# Patient Record
Sex: Male | Born: 1980 | Race: Black or African American | Hispanic: No | Marital: Single | Smoking: Never smoker
Health system: Southern US, Community
[De-identification: ages and names within clinical notes are randomized; demographics above are authoritative.]

## PROBLEM LIST (undated history)

## (undated) DIAGNOSIS — T7840XA Allergy, unspecified, initial encounter: Secondary | ICD-10-CM

## (undated) DIAGNOSIS — M419 Scoliosis, unspecified: Secondary | ICD-10-CM

## (undated) HISTORY — PX: SPINE SURGERY: SHX786

## (undated) HISTORY — DX: Scoliosis, unspecified: M41.9

## (undated) HISTORY — DX: Allergy, unspecified, initial encounter: T78.40XA

---

## 2005-10-27 ENCOUNTER — Emergency Department (HOSPITAL_COMMUNITY): Admission: EM | Admit: 2005-10-27 | Discharge: 2005-10-27 | Payer: Self-pay | Admitting: Emergency Medicine

## 2010-04-04 ENCOUNTER — Emergency Department: Payer: Self-pay | Admitting: Emergency Medicine

## 2011-02-11 IMAGING — CT CT HEAD WITHOUT CONTRAST
2 series · 16 of 30 positions shown, 20 images · non-contrast
Comparison: none

REASON FOR EXAM: HA ... pt in WR
COMMENTS:

PROCEDURE:     CT  - CT HEAD WITHOUT CONTRAST  - April 04, 2010 [DATE]
RESULT:     Comparison:  None
TECHNIQUE: Multiple axial images from the foramen magnum to the vertex were
obtained without IV contrast.

[Series 2: without · axial · non-contrast · 0.39mm/px · z∈[+393,+513]mm · 13 of 28 slices shown, 17 images]
[im 2/28  brain]
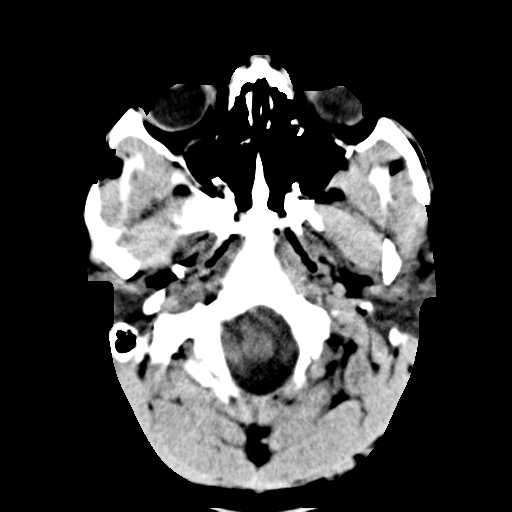
[im 2/28  bone]
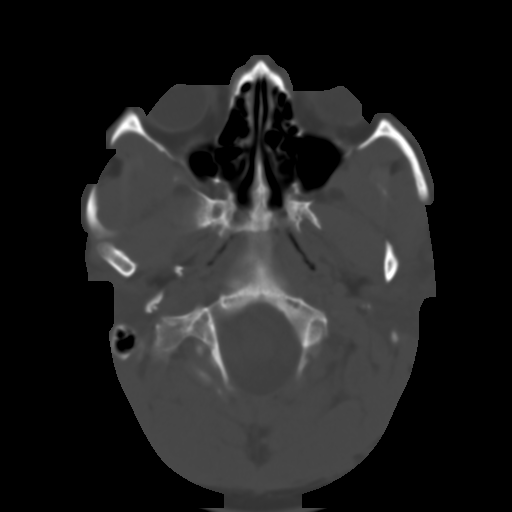
[im 4/28  brain]
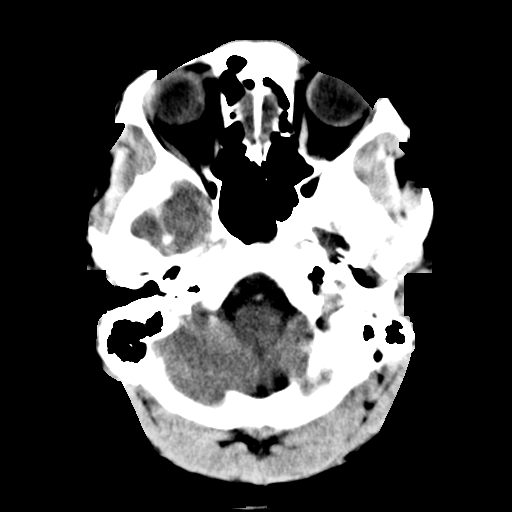
[im 6/28  brain]
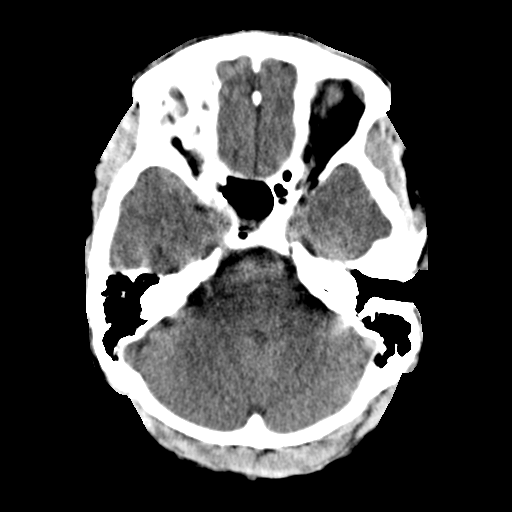
[im 8/28  brain]
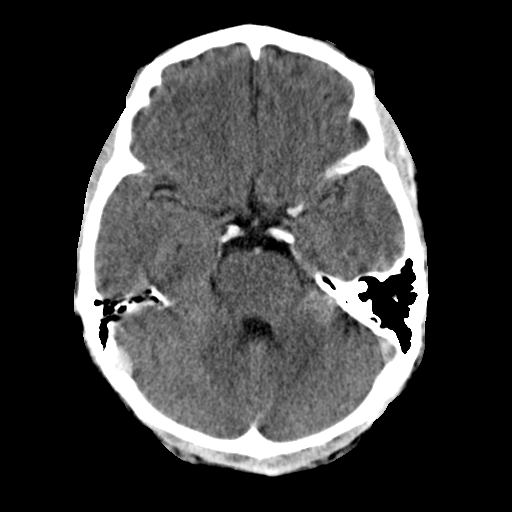
[im 10/28  brain]
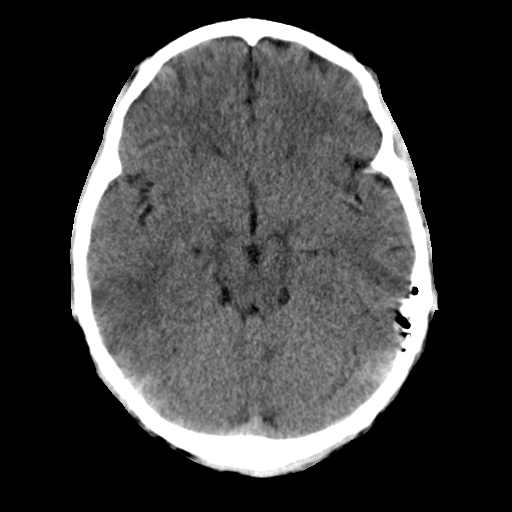
[im 10/28  bone]
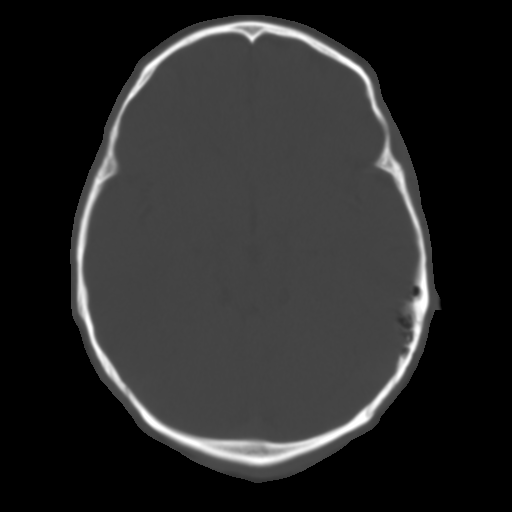
[im 12/28  brain]
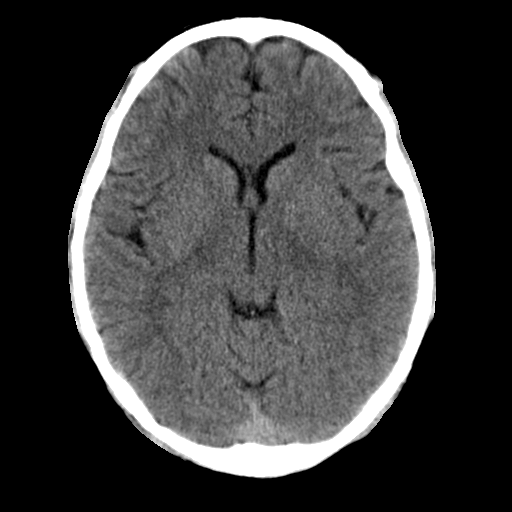
[im 14/28  brain]
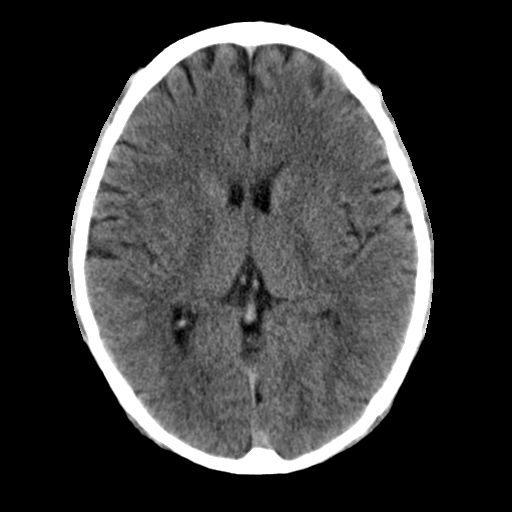
[im 16/28  brain]
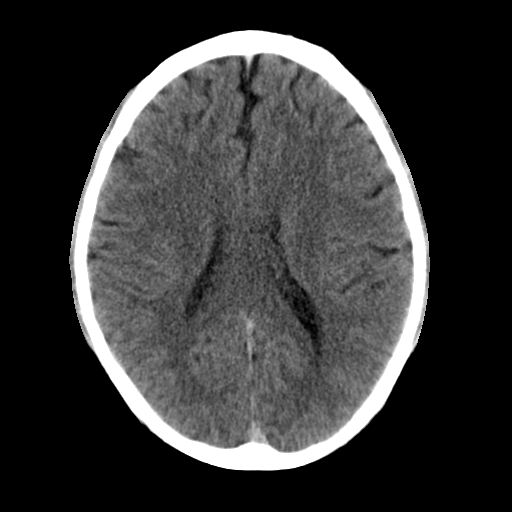
[im 18/28  brain]
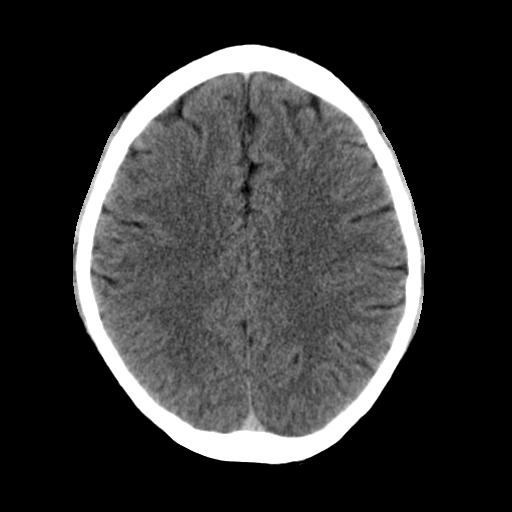
[im 18/28  bone]
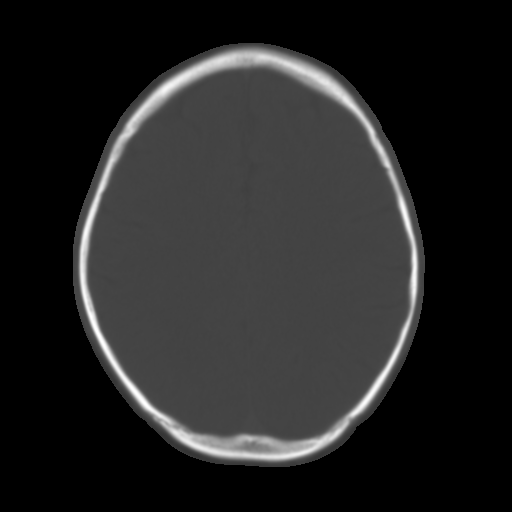
[im 20/28  brain]
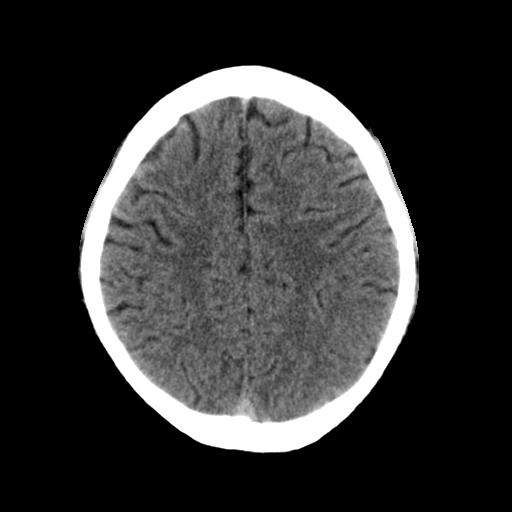
[im 22/28  brain]
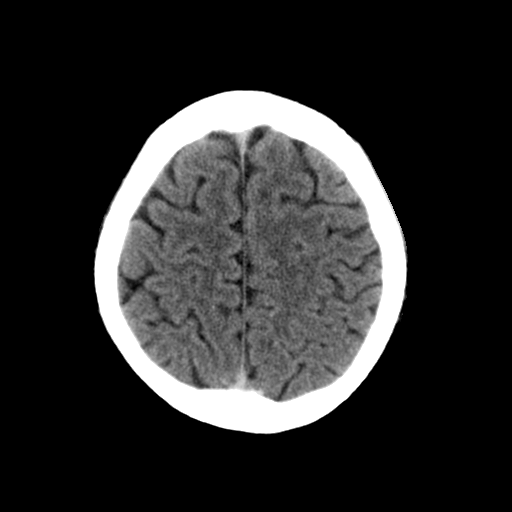
[im 24/28  brain]
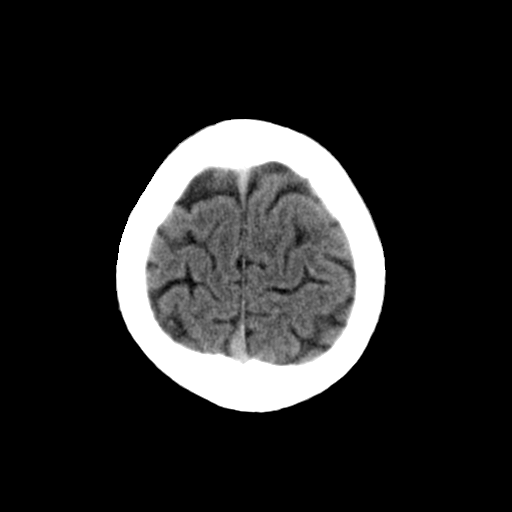
[im 26/28  brain]
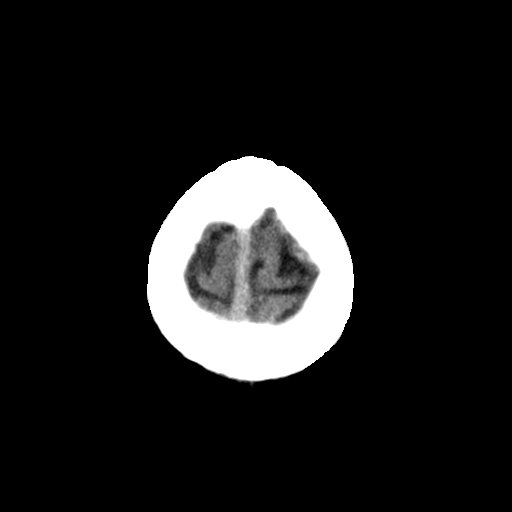
[im 26/28  bone]
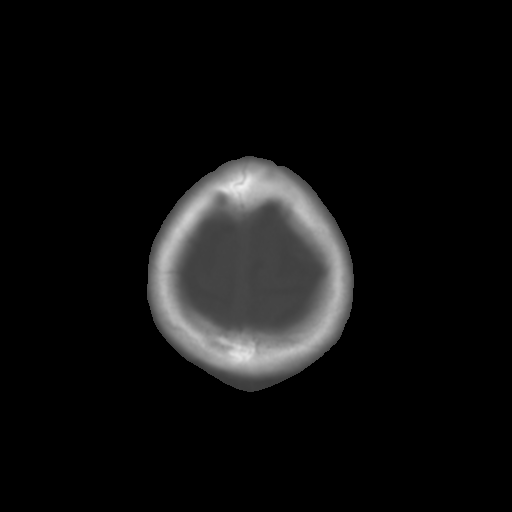

[Series 3: bone · axial · 0.39mm/px · z∈[+393,+433]mm · 3 of 28 slices shown]
[im 2/28  bone]
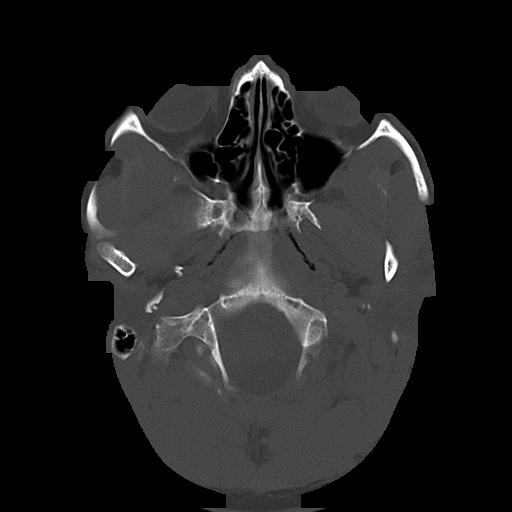
[im 6/28  bone]
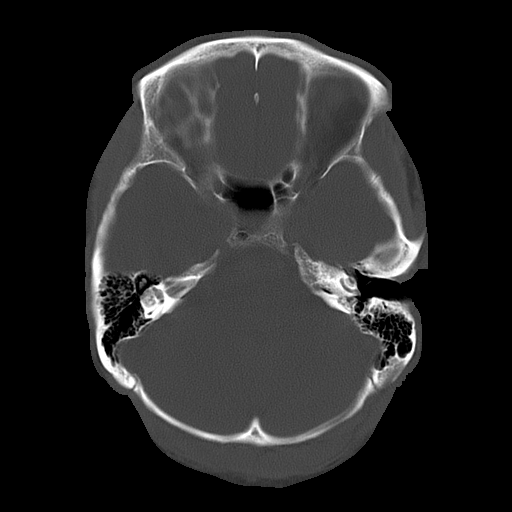
[im 10/28  bone]
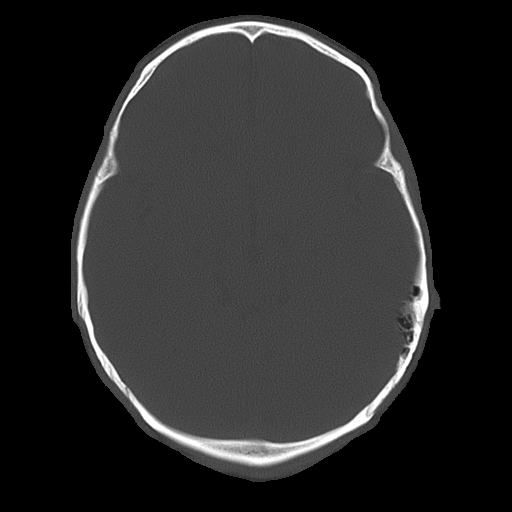

[16 of 30 positions shown; findings below may reference images not displayed]

FINDINGS: There is no evidence of mass effect, midline shift, or extra-axial fluid
collections.  There is no evidence of a space-occupying lesion or
intracranial hemorrhage. There is no evidence of a cortical-based area of
acute infarction.

The ventricles and sulci are appropriate for the patient's age. The basal
cisterns are patent.

Visualized portions of the orbits are unremarkable. The visualized portions
of the paranasal sinuses and mastoid air cells are unremarkable.

The osseous structures are unremarkable.
IMPRESSION: No acute intracranial process.

## 2013-10-09 ENCOUNTER — Encounter: Payer: Self-pay | Admitting: Family Medicine

## 2013-10-09 ENCOUNTER — Ambulatory Visit (INDEPENDENT_AMBULATORY_CARE_PROVIDER_SITE_OTHER): Payer: 59 | Admitting: Family Medicine

## 2013-10-09 VITALS — BP 130/84 | HR 84 | Temp 98.7°F | Resp 16 | Ht 67.75 in | Wt 173.2 lb

## 2013-10-09 DIAGNOSIS — Z0000 Encounter for general adult medical examination without abnormal findings: Secondary | ICD-10-CM

## 2013-10-09 DIAGNOSIS — R0683 Snoring: Secondary | ICD-10-CM

## 2013-10-09 DIAGNOSIS — Z9189 Other specified personal risk factors, not elsewhere classified: Secondary | ICD-10-CM

## 2013-10-09 DIAGNOSIS — J309 Allergic rhinitis, unspecified: Secondary | ICD-10-CM

## 2013-10-09 DIAGNOSIS — R0989 Other specified symptoms and signs involving the circulatory and respiratory systems: Secondary | ICD-10-CM

## 2013-10-09 DIAGNOSIS — G479 Sleep disorder, unspecified: Secondary | ICD-10-CM

## 2013-10-09 DIAGNOSIS — R0609 Other forms of dyspnea: Secondary | ICD-10-CM

## 2013-10-09 DIAGNOSIS — Z789 Other specified health status: Secondary | ICD-10-CM

## 2013-10-09 DIAGNOSIS — Z113 Encounter for screening for infections with a predominantly sexual mode of transmission: Secondary | ICD-10-CM

## 2013-10-09 DIAGNOSIS — Z8271 Family history of polycystic kidney: Secondary | ICD-10-CM

## 2013-10-09 LAB — POCT URINALYSIS DIPSTICK
Bilirubin, UA: NEGATIVE
Blood, UA: NEGATIVE
Glucose, UA: NEGATIVE
Ketones, UA: NEGATIVE
Leukocytes, UA: NEGATIVE
Nitrite, UA: NEGATIVE
PH UA: 7
SPEC GRAV UA: 1.02
UROBILINOGEN UA: 0.2

## 2013-10-09 NOTE — Progress Notes (Signed)
Subjective:    Patient ID: Bryan ReilBrian Banfill, male    DOB: 1981/07/30, 33 y.o.   MRN: [id number]  HPI  This 33 y.o. AA male is here for CPE prompted by recent diagnosis of family member w/ polycystic kidney disease (brother age 31). Pt advised by his mother to have fatigue/ snoring and sleep disturbance evaluated. He consumes excessive amounts of caffeine. Nutrition is fair.  HCM: Vision- Not recently.           Dental- Not recently.           IMM- Thinks they are current.  PMHx, Surg Hx, Soc and Fam Hx reviewed.  Review of Systems  Constitutional: Positive for fatigue.  HENT: Positive for rhinorrhea.   Eyes: Negative.   Cardiovascular: Negative.   Gastrointestinal: Negative.   Endocrine: Negative.   Genitourinary: Negative.   Musculoskeletal: Negative.   Skin: Negative.   Allergic/Immunologic: Positive for environmental allergies.  Neurological: Negative.   Hematological: Negative.   Psychiatric/Behavioral: Positive for sleep disturbance.      Objective:   Physical Exam  Nursing note and vitals reviewed. Constitutional: He is oriented to person, place, and time. Vital signs are normal. He appears well-developed and well-nourished. No distress.  HENT:  Head: Normocephalic and atraumatic.  Right Ear: Hearing, tympanic membrane, external ear and ear canal normal.  Left Ear: Hearing, tympanic membrane, external ear and ear canal normal.  Nose: Mucosal edema present. No rhinorrhea, nasal deformity or septal deviation.  Mouth/Throat: Uvula is midline, oropharynx is clear and moist and mucous membranes are normal. No oral lesions. Normal dentition. No dental caries.  Eyes: Conjunctivae, EOM and lids are normal. Pupils are equal, round, and reactive to light. No scleral icterus.  Fundoscopic exam:      The right eye shows no arteriolar narrowing, no AV nicking, no hemorrhage and no papilledema. The right eye shows red reflex.       The left eye shows no arteriolar narrowing, no AV  nicking, no hemorrhage and no papilledema. The left eye shows red reflex.  Neck: Trachea normal, normal range of motion and full passive range of motion without pain. Neck supple. No spinous process tenderness present. No mass and no thyromegaly present.  Cardiovascular: Normal rate, regular rhythm, S1 normal, S2 normal, normal heart sounds and normal pulses.   No extrasystoles are present. PMI is not displaced.  Exam reveals no gallop and no friction rub.   No murmur heard. Pulmonary/Chest: Effort normal and breath sounds normal. No respiratory distress.  Abdominal: Soft. Normal appearance and bowel sounds are normal. He exhibits no distension and no mass. There is no hepatosplenomegaly. There is no tenderness. There is no guarding and no CVA tenderness. Hernia confirmed negative in the right inguinal area and confirmed negative in the left inguinal area.  Genitourinary: Testes normal and penis normal.  Musculoskeletal: Normal range of motion. He exhibits no edema and no tenderness.  Lymphadenopathy:       Head (right side): No submental, no submandibular, no tonsillar, no preauricular, no posterior auricular and no occipital adenopathy present.       Head (left side): No submental, no submandibular, no tonsillar, no preauricular, no posterior auricular and no occipital adenopathy present.    He has no cervical adenopathy.    He has no axillary adenopathy.       Right: No inguinal and no supraclavicular adenopathy present.       Left: No inguinal and no supraclavicular adenopathy present.  Neurological: He is  alert and oriented to person, place, and time. He has normal strength and normal reflexes. He displays no atrophy. No cranial nerve deficit or sensory deficit. He exhibits normal muscle tone. Coordination and gait normal.  Skin: Skin is warm, dry and intact. No bruising, no lesion and no rash noted. He is not diaphoretic. No cyanosis or erythema. Nails show no clubbing.     Back- well healed  midline scar s/p scoliosis surgery.  Psychiatric: He has a normal mood and affect. His speech is normal and behavior is normal. Judgment and thought content normal. Cognition and memory are normal.    Results for orders placed in visit on 10/09/13  POCT URINALYSIS DIPSTICK      Result Value Ref Range   Color, UA yellow     Clarity, UA clear     Glucose, UA neg     Bilirubin, UA neg     Ketones, UA neg     Spec Grav, UA 1.020     Blood, UA neg     pH, UA 7.0     Protein, UA trace     Urobilinogen, UA 0.2     Nitrite, UA neg     Leukocytes, UA Negative        Assessment & Plan:  Routine general medical examination at a health care facility - Plan: Vitamin D, 25-hydroxy, Comprehensive metabolic panel, CBC with Differential, POCT urinalysis dipstick, Lipid panel, CANCELED: Lipid panel  Snoring - Plan: Split night study  Allergic rhinitis- Trial OTC AYR saline nasal mist.  Sleep disturbance, unspecified - Plan: Split night study  Excessive caffeine intake- Advised reducing this substance. Increase water intake.  Family history of polycystic kidney disease - Plan: Comprehensive metabolic panel, CBC with Differential, CANCELED: Lipid panel  Screening examination for venereal disease - Plan: HIV antibody, RPR

## 2013-10-09 NOTE — Patient Instructions (Signed)
Keeping you healthy  Get these tests  Blood pressure- Have your blood pressure checked once a year by your healthcare provider.  Normal blood pressure is 120/80.  Weight- Have your body mass index (BMI) calculated to screen for obesity.  BMI is a measure of body fat based on height and weight. You can also calculate your own BMI at [email or web address]-[email or web address]/.  Cholesterol- Have your cholesterol checked regularly starting at age 31, sooner may be necessary if you have diabetes, high blood pressure, if a family member developed heart diseases at an early age or if you smoke.   Chlamydia, HIV, and other sexual transmitted disease- Get screened each year until the age of 31 then within three months of each new sexual partner.  Diabetes- Have your blood sugar checked regularly if you have high blood pressure, high cholesterol, a family history of diabetes or if you are overweight.  Get these vaccines  Flu shot- Every fall.  Tetanus shot- Every 10 years. Please check to see when you  Last had a Tetanus or Tdap.  Menactra- Single dose; prevents meningitis.  Take these steps  Don't smoke- If you do smoke, ask your healthcare provider about quitting. For tips on how to quit, go to www.smokefree.gov or call 1-800-QUIT-NOW.  Be physically active- Exercise 5 days a week for at least 30 minutes.  If you are not already physically active start slow and gradually work up to 30 minutes of moderate physical activity.  Examples of moderate activity include walking briskly, mowing the yard, dancing, swimming bicycling, etc.  Eat a healthy diet- Eat a variety of healthy foods such as fruits, vegetables, low fat milk, low fat cheese, yogurt, lean meats, poultry, fish, beans, tofu, etc.  For more information on healthy eating, go to www.thenutritionsource.org  Drink alcohol in moderation- Limit alcohol intake two drinks or less a day.  Never drink and drive.  Dentist- Brush and floss teeth twice daily;  visit your dentis twice a year.  Depression-Your emotional health is as important as your physical health.  If you're feeling down, losing interest in things you normally enjoy please talk with your healthcare provider.  Gun Safety- If you keep a gun in your home, keep it unloaded and with the safety lock on.  Bullets should be stored separately.  Helmet use- Always wear a helmet when riding a motorcycle, bicycle, rollerblading or skateboarding.  Safe sex- If you may be exposed to a sexually transmitted infection, use a condom  Seat belts- Seat bels can save your life; always wear one.  Smoke/Carbon Monoxide detectors- These detectors need to be installed on the appropriate level of your home.  Replace batteries at least once a year.  Skin Cancer- When out in the sun, cover up and use sunscreen SPF 15 or higher.  Violence- If anyone is threatening or hurting you, please tell your healthcare provider.   Sleep Apnea  Sleep apnea is a sleep disorder characterized by abnormal pauses in breathing while you sleep. When your breathing pauses, the level of oxygen in your blood decreases. This causes you to move out of deep sleep and into light sleep. As a result, your quality of sleep is poor, and the system that carries your blood throughout your body (cardiovascular system) experiences stress. If sleep apnea remains untreated, the following conditions can develop:  High blood pressure (hypertension).  Coronary artery disease.  Inability to achieve or maintain an erection (impotence).  Impairment of your thought process (cognitive dysfunction). There are three  types of sleep apnea: 1. Obstructive sleep apnea Pauses in breathing during sleep because of a blocked airway. 2. Central sleep apnea Pauses in breathing during sleep because the area of the brain that controls your breathing does not send the correct signals to the muscles that control breathing. 3. Mixed sleep apnea A combination of  both obstructive and central sleep apnea. RISK FACTORS The following risk factors can increase your risk of developing sleep apnea:  Being overweight.  Smoking.  Having narrow passages in your nose and throat.  Being of older age.  Being male.  Alcohol use.  Sedative and tranquilizer use.  Ethnicity. Among individuals younger than 35 years, African Americans are at increased risk of sleep apnea. SYMPTOMS   Difficulty staying asleep.  Daytime sleepiness and fatigue.  Loss of energy.  Irritability.  Loud, heavy snoring.  Morning headaches.  Trouble concentrating.  Forgetfulness.  Decreased interest in sex. DIAGNOSIS  In order to diagnose sleep apnea, your caregiver will perform a physical examination. Your caregiver may suggest that you take a home sleep test. Your caregiver may also recommend that you spend the night in a sleep lab. In the sleep lab, several monitors record information about your heart, lungs, and brain while you sleep. Your leg and arm movements and blood oxygen level are also recorded. TREATMENT The following actions may help to resolve mild sleep apnea:  Sleeping on your side.   Using a decongestant if you have nasal congestion.   Avoiding the use of depressants, including alcohol, sedatives, and narcotics.   Losing weight and modifying your diet if you are overweight. There also are devices and treatments to help open your airway:  Oral appliances. These are custom-made mouthpieces that shift your lower jaw forward and slightly open your bite. This opens your airway.  Devices that create positive airway pressure. This positive pressure "splints" your airway open to help you breathe better during sleep. The following devices create positive airway pressure:  Continuous positive airway pressure (CPAP) device. The CPAP device creates a continuous level of air pressure with an air pump. The air is delivered to your airway through a mask while  you sleep. This continuous pressure keeps your airway open.  Nasal expiratory positive airway pressure (EPAP) device. The EPAP device creates positive air pressure as you exhale. The device consists of single-use valves, which are inserted into each nostril and held in place by adhesive. The valves create very little resistance when you inhale but create much more resistance when you exhale. That increased resistance creates the positive airway pressure. This positive pressure while you exhale keeps your airway open, making it easier to breath when you inhale again.  Bilevel positive airway pressure (BPAP) device. The BPAP device is used mainly in patients with central sleep apnea. This device is similar to the CPAP device because it also uses an air pump to deliver continuous air pressure through a mask. However, with the BPAP machine, the pressure is set at two different levels. The pressure when you exhale is lower than the pressure when you inhale.  Surgery. Typically, surgery is only done if you cannot comply with less invasive treatments or if the less invasive treatments do not improve your condition. Surgery involves removing excess tissue in your airway to create a wider passage way. Document Released: 07/13/2002 Document Revised: 11/17/2012 Document Reviewed: 11/29/2011 [facility] Patient Information 2014 [place], [place].

## 2013-10-10 ENCOUNTER — Other Ambulatory Visit: Payer: Self-pay | Admitting: Family Medicine

## 2013-10-10 DIAGNOSIS — Z8271 Family history of polycystic kidney: Secondary | ICD-10-CM

## 2013-10-10 LAB — COMPREHENSIVE METABOLIC PANEL
ALT: 29 IU/L (ref 0–44)
AST: 26 IU/L (ref 0–40)
Albumin/Globulin Ratio: 1.8 (ref 1.1–2.5)
Albumin: 5 g/dL (ref 3.5–5.5)
Alkaline Phosphatase: 60 IU/L (ref 39–117)
BILIRUBIN TOTAL: 0.6 mg/dL (ref 0.0–1.2)
BUN/Creatinine Ratio: 11 (ref 8–19)
BUN: 15 mg/dL (ref 6–20)
CALCIUM: 9.6 mg/dL (ref 8.7–10.2)
CO2: 24 mmol/L (ref 18–29)
Chloride: 102 mmol/L (ref 97–108)
Creatinine, Ser: 1.35 mg/dL — ABNORMAL HIGH (ref 0.76–1.27)
GFR, EST AFRICAN AMERICAN: 81 mL/min/{1.73_m2} (ref >59)
GFR, EST NON AFRICAN AMERICAN: 70 mL/min/{1.73_m2} (ref >59)
GLOBULIN, TOTAL: 2.8 g/dL (ref 1.5–4.5)
GLUCOSE: 94 mg/dL (ref 65–99)
POTASSIUM: 4.7 mmol/L (ref 3.5–5.2)
SODIUM: 142 mmol/L (ref 134–144)
TOTAL PROTEIN: 7.8 g/dL (ref 6.0–8.5)

## 2013-10-10 LAB — RPR: RPR: NONREACTIVE

## 2013-10-10 LAB — CBC WITH DIFFERENTIAL/PLATELET
BASOS ABS: 0 10*3/uL (ref 0.0–0.2)
Basos: 1 %
EOS ABS: 0.1 10*3/uL (ref 0.0–0.4)
Eos: 2 %
HEMATOCRIT: 47.8 % (ref 37.5–51.0)
HEMOGLOBIN: 16.6 g/dL (ref 12.6–17.7)
IMMATURE GRANULOCYTES: 0 %
Immature Grans (Abs): 0 10*3/uL (ref 0.0–0.1)
Lymphocytes Absolute: 1.8 10*3/uL (ref 0.7–3.1)
Lymphs: 32 %
MCH: 29.7 pg (ref 26.6–33.0)
MCHC: 34.7 g/dL (ref 31.5–35.7)
MCV: 86 fL (ref 79–97)
MONOS ABS: 0.6 10*3/uL (ref 0.1–0.9)
Monocytes: 11 %
NEUTROS ABS: 3 10*3/uL (ref 1.4–7.0)
Neutrophils Relative %: 54 %
RBC: 5.58 x10E6/uL (ref 4.14–5.80)
RDW: 13.5 % (ref 12.3–15.4)
WBC: 5.5 10*3/uL (ref 3.4–10.8)

## 2013-10-10 LAB — HIV ANTIBODY (ROUTINE TESTING W REFLEX): HIV-1/HIV-2 Ab: NONREACTIVE

## 2013-10-10 LAB — LIPID PANEL
CHOL/HDL RATIO: 3.6 ratio (ref 0.0–5.0)
CHOLESTEROL TOTAL: 184 mg/dL (ref 100–199)
HDL: 51 mg/dL (ref >39)
LDL CALC: 118 mg/dL — AB (ref 0–99)
Triglycerides: 76 mg/dL (ref 0–149)
VLDL CHOLESTEROL CAL: 15 mg/dL (ref 5–40)

## 2013-10-10 LAB — VITAMIN D 25 HYDROXY (VIT D DEFICIENCY, FRACTURES): VIT D 25 HYDROXY: 10.8 ng/mL — AB (ref 30.0–100.0)

## 2013-10-10 MED ORDER — ERGOCALCIFEROL 1.25 MG (50000 UT) PO CAPS: 50000.0000 [IU] | ORAL_CAPSULE | ORAL | Status: DC

## 2013-10-10 NOTE — Progress Notes (Signed)
Quick Note:  Please advise pt regarding following labs... Vitamin D level is extremely low; you need a prescription supplement to get your levels up to normal. I am e-prescribing this supplement and it should be ready for pick-up from your pharmacy no later than Monday.  Your kidney function test is above normal (Creatinine= 1.35). Increase your water intake and improve your nutrition. I would like your to schedule a follow-up appointment. We may need to take a look at your kidneys (with an ultrasound) given that your brother has polycystic kidneys.  Blood counts are normal. HIV and RPR (syphilis test) are negative. Lipid panel is good except LDL ("bad") cholesterol is a little above normal. Again, better nutrition can improve this number.  Copy to pt. ______

## 2013-10-12 ENCOUNTER — Telehealth: Payer: Self-pay

## 2013-10-12 DIAGNOSIS — R0683 Snoring: Secondary | ICD-10-CM

## 2013-10-12 DIAGNOSIS — G479 Sleep disorder, unspecified: Secondary | ICD-10-CM

## 2013-10-12 NOTE — Telephone Encounter (Signed)
Thanks for that correction. I got ahead of myself.

## 2013-10-12 NOTE — Telephone Encounter (Signed)
[facility] called and stated that we had put in an order for sleep study instead of a REFERRAL to neuro to eval for sleep study. Sending in correct order. Dr Audria NineMcPherson, [place] ChildesFYI.

## 2013-10-30 ENCOUNTER — Encounter: Payer: Self-pay | Admitting: Neurology

## 2013-10-30 ENCOUNTER — Ambulatory Visit (INDEPENDENT_AMBULATORY_CARE_PROVIDER_SITE_OTHER): Payer: 59 | Admitting: Neurology

## 2013-10-30 VITALS — BP 125/77 | HR 82 | Temp 98.3°F | Ht 68.0 in | Wt 175.0 lb

## 2013-10-30 DIAGNOSIS — G4733 Obstructive sleep apnea (adult) (pediatric): Secondary | ICD-10-CM

## 2013-10-30 NOTE — Progress Notes (Signed)
Subjective:    Patient ID: Bryan Burke is a 33 y.o. male.  HPI    Huston Foley, MD, PhD [facility] [address] [address] [place], [place] [address]  Dear Dr. Audria Nine,  I saw your patient, Bryan Burke, upon your kind request in my neurologic clinic today for initial consultation of his sleep disorder, in particular, concern for obstructive sleep apnea. The patient is unaccompanied today. As you know, Bryan Burke is a 33 year old right-handed gentleman with an underlying medical history of allergic rhinitis and a family history of polycystic  kidney disease (brother), s/p scoliosis surgery at age 28, who reports snoring and daytime somnolence. You recently saw him earlier this month and advised him to reduce his caffeine intake. He was diagnosed with vitamin D deficiency (10.8 on 10/09/13) and has been started on high dose Vit D since then.  He wakes up gasping. He has since then stopped his caffeinated soda (Montain Dew) intake. He was drinking 3 cans a day at the time. He drinks no coffee and rare sweet tea. He has gradually gained weight in the realm of 15 lb in the last 2 years. He works from 7 AM to 3:30 PM; he works as a Designer, industrial/product at [employer].   His typical bedtime is reported to be around 10 PM and usual wake time is around 5 AM. Sleep onset typically occurs within 15-20 minutes. He reports feeling marginally rested upon awakening. He wakes up on an average 2 times in the middle of the night and has to go to the bathroom 1 times on a typical night. He eats late at 8 to 8:30 PM. He denies morning headaches.  He reports excessive daytime somnolence (EDS) and His Epworth Sleepiness Score (ESS) is 7/24 today. He has not fallen asleep while driving. The patient has not been taking a planned nap.  He has been known to snore for the past few years. Snoring is reportedly marked, and unclear is it is associated with choking sounds and witnessed apneas. The patient  admits to a sense of gasping for air in the night and some choking feeling. There is no report of nighttime reflux, with no nighttime cough experienced. The patient has not noted any RLS symptoms and is not known to kick while asleep or before falling asleep. There is family history of OSA in his uncle.   He is a restless sleeper and in the morning, the bed is quite disheveled.   He denies cataplexy, sleep paralysis, hypnagogic or hypnopompic hallucinations, or sleep attacks. He does not report any vivid dreams, nightmares, dream enactments, or parasomnias, such as sleep talking or sleep walking. The patient has not had a sleep study or a home sleep test.  There is a TV in the bedroom and usually it is on all night. He has no pets. He lives alone.   His Past Medical History Is Significant For: Past Medical History  Diagnosis Date  . Scoliosis   . Allergy     His Past Surgical History Is Significant For: Past Surgical History  Procedure Laterality Date  . Spine surgery  1997-Scoliosis    [facility]    His Family History Is Significant For: Family History  Problem Relation Age of Onset  . Cancer Father   . Kidney disease Brother 61    Polycystic kidney disease    His Social History Is Significant For: History   Social History  . Marital Status: Single    Spouse  Name: N/A    Number of Children: N/A  . Years of Education: N/A   Social History Main Topics  . Smoking status: Never Smoker   . Smokeless tobacco: None  . Alcohol Use: 3.6 oz/week    6 Cans of beer per week  . Drug Use: No  . Sexual Activity: None   Other Topics Concern  . None   Social History Narrative  . None    His Allergies Are:  No Known Allergies:   His Current Medications Are:  Outpatient Encounter Prescriptions as of 10/30/2013  Medication Sig  . ergocalciferol (DRISDOL) 50000 UNITS capsule Take 1 capsule (50,000 Units total) by mouth once a week.  :  Review of Systems:  Out of a complete 14 point  review of systems, all are reviewed and negative with the exception of these symptoms as listed below:   Review of Systems  Constitutional: Negative.   HENT: Negative.   Eyes: Negative.   Respiratory: Positive for apnea (snoring).   Cardiovascular: Negative.   Gastrointestinal: Negative.   Endocrine: Negative.   Genitourinary: Negative.   Musculoskeletal: Negative.   Skin: Negative.   Allergic/Immunologic: Negative.   Neurological: Negative.   Hematological: Negative.   Psychiatric/Behavioral: Positive for sleep disturbance (snoring, e.d.s.).    Objective:  Neurologic Exam  Physical Exam Physical Examination:   Filed Vitals:   10/30/13 1033  BP: 125/77  Pulse: 82  Temp: 98.3 F (36.8 C)    General Examination: The patient is a very pleasant 33 y.o. male in no acute distress. He appears well-developed and well-nourished and well groomed.   HEENT: Normocephalic, atraumatic, pupils are equal, round and reactive to light and accommodation. Funduscopic exam is normal with sharp disc margins noted. Extraocular tracking is good without limitation to gaze excursion or nystagmus noted. Normal smooth pursuit is noted. Hearing is grossly intact. Tympanic membranes are clear bilaterally. Face is symmetric with normal facial animation and normal facial sensation. Speech is clear with no dysarthria noted. There is no hypophonia. There is no lip, neck/head, jaw or voice tremor. Neck is supple with full range of passive and active motion. There are no carotid bruits on auscultation. Oropharynx exam reveals: mild mouth dryness, good dental hygiene and moderate airway crowding, due to larger tongue and redundant soft palate. Mallampati is class II. Tongue protrudes centrally and palate elevates symmetrically. Tonsils are small. Neck size is 16 inches.   Chest: Clear to auscultation without wheezing, rhonchi or crackles noted.  Heart: S1+S2+0, regular and normal without murmurs, rubs or gallops  noted.   Abdomen: Soft, non-tender and non-distended with normal bowel sounds appreciated on auscultation.  Extremities: There is no pitting edema in the distal lower extremities bilaterally. Pedal pulses are intact.  Skin: Warm and dry without trophic changes noted. There are no varicose veins.  Musculoskeletal: exam reveals no obvious joint deformities, tenderness or joint swelling or erythema.   Neurologically:  Mental status: The patient is awake, alert and oriented in all 4 spheres. His immediate and remote memory, attention, language skills and fund of knowledge are appropriate. There is no evidence of aphasia, agnosia, apraxia or anomia. Speech is clear with normal prosody and enunciation. Thought process is linear. Mood is normal and affect is normal.  Cranial nerves II - XII are as described above under HEENT exam. In addition: shoulder shrug is normal with equal shoulder height noted. Motor exam: Normal bulk, strength and tone is noted. There is no drift, tremor or rebound. Romberg is  negative. Reflexes are 2+ throughout. Babinski: Toes are flexor bilaterally. Fine motor skills and coordination: intact with normal finger taps, normal hand movements, normal rapid alternating patting, normal foot taps and normal foot agility.  Cerebellar testing: No dysmetria or intention tremor on finger to nose testing. Heel to shin is unremarkable bilaterally. There is no truncal or gait ataxia.  Sensory exam: intact to light touch, pinprick, vibration, temperature sense and proprioception in the upper and lower extremities.  Gait, station and balance: He stands easily. No veering to one side is noted. No leaning to one side is noted. Posture is age-appropriate and stance is narrow based. Gait shows normal stride length and normal pace. No problems turning are noted. He turns en bloc. Tandem walk is unremarkable. Intact toe and heel stance is noted.               Assessment and Plan:   In summary,  Bryan Burke is a very pleasant 33 y.o.-year old male with a history and physical exam concerning for obstructive sleep apnea (OSA), in fact he reports loud snoring, waking up gasping for air, and some daytime tiredness. He does report a family history of sleep apnea in his uncle who has a CPAP machine.  I had a long chat with the patient about my findings and the diagnosis of OSA, its prognosis and treatment options. We talked about medical treatments, surgical interventions and non-pharmacological approaches. I explained in particular the risks and ramifications of untreated moderate to severe OSA, especially with respect to developing cardiovascular disease down the Road, including congestive heart failure, difficult to treat hypertension, cardiac arrhythmias, or stroke. Even type 2 diabetes has, in part, been linked to untreated OSA. Symptoms of untreated OSA include daytime sleepiness, memory problems, mood irritability and mood disorder such as depression and anxiety, lack of energy, as well as recurrent headaches, especially morning headaches. We talked about trying to maintain a healthy lifestyle in general, as well as the importance of weight control. I encouraged the patient to eat healthy, exercise daily and keep well hydrated, to keep a scheduled bedtime and wake time routine, to not skip any meals and eat healthy snacks in between meals. I advised the patient not to drive when feeling sleepy. He is advised to try to turn off his TV at night. He can start gradually by putting the TV on the time her. He's also advised to try to have his evening meal little earlier than 8 to 8:30 PM.  I recommended the following at this time: sleep study with potential positive airway pressure titration.  I explained the sleep test procedure to the patient and also outlined possible surgical and non-surgical treatment options of OSA, including the use of a custom-made dental device (which would require a referral to a  specialist dentist or oral surgeon), upper airway surgical options, such as pillar implants, radiofrequency surgery, tongue base surgery, and UPPP (which would involve a referral to an ENT surgeon). Rarely, jaw surgery such as mandibular advancement may be considered.  I also explained the CPAP treatment option to the patient, who indicated that he would be willing to try CPAP if the need arises. I explained the importance of being compliant with PAP treatment, not only for insurance purposes but primarily to improve His symptoms, and for the patient's long term health benefit, including to reduce His cardiovascular risks. I answered all his questions today and the patient was in agreement. I would like to see him back after the  sleep study is completed and encouraged him to call with any interim questions, concerns, problems or updates.   Thank you very much for allowing me to participate in the care of this nice patient. If I can be of any further assistance to you please do not hesitate to call me at [phone].  Sincerely,   Huston FoleySaima Nolin Grell, MD, PhD

## 2013-10-30 NOTE — Patient Instructions (Addendum)
Based on your symptoms and your exam I believe you are at risk for obstructive sleep apnea or OSA, and I think we should proceed with a sleep study to determine whether you do or do not have OSA and how severe it is. If you have more than mild OSA, I want you to consider treatment with CPAP. Please remember, the risks and ramifications of moderate to severe obstructive sleep apnea or OSA are: Cardiovascular disease, including congestive heart failure, stroke, difficult to control hypertension, arrhythmias, and even type 2 diabetes has been linked to untreated OSA. Sleep apnea causes disruption of sleep and sleep deprivation in most cases, which, in turn, can cause recurrent headaches, problems with memory, mood, concentration, focus, and vigilance. Most people with untreated sleep apnea report excessive daytime sleepiness, which can affect their ability to drive. Please do not drive if you feel sleepy.  I will see you back after your sleep study to go over the test results and where to go from there. We will call you after your sleep study and to set up an appointment at the time.   Based on your insurance coverage, we may have to resort to doing a home sleep test (HST), as described.   Please remember to try to maintain good sleep hygiene, which means: Keep a regular sleep and wake schedule, try not to exercise or have a meal within 2 hours of your bedtime, try to keep your bedroom conducive for sleep, that is, cool and dark, without light distractors such as an illuminated alarm clock, and refrain from watching TV right before sleep or in the middle of the night and do not keep the TV or radio on during the night. Also, try not to use or play on electronic devices at bedtime, such as your cell phone, tablet PC or laptop. If you like to read at bedtime on an electronic device, try to dim the background light as much as possible. Do not eat in the middle of the night.

## 2013-11-27 ENCOUNTER — Telehealth: Payer: Self-pay | Admitting: Neurology

## 2013-11-27 NOTE — Telephone Encounter (Signed)
Pt would like to cancel his sleep study at this time because he is unable to afford the out of pocket expense.  He will follow up with our office at a later date when he feels that he is able to proceed with testing.

## 2014-05-12 ENCOUNTER — Ambulatory Visit (INDEPENDENT_AMBULATORY_CARE_PROVIDER_SITE_OTHER): Payer: 59 | Admitting: Family Medicine

## 2014-05-12 ENCOUNTER — Encounter: Payer: Self-pay | Admitting: Family Medicine

## 2014-05-12 VITALS — BP 128/74 | HR 77 | Temp 98.5°F | Resp 16 | Ht 68.0 in | Wt 177.8 lb

## 2014-05-12 DIAGNOSIS — E559 Vitamin D deficiency, unspecified: Secondary | ICD-10-CM

## 2014-05-12 DIAGNOSIS — R599 Enlarged lymph nodes, unspecified: Secondary | ICD-10-CM

## 2014-05-12 DIAGNOSIS — R590 Localized enlarged lymph nodes: Secondary | ICD-10-CM

## 2014-05-12 DIAGNOSIS — J309 Allergic rhinitis, unspecified: Secondary | ICD-10-CM

## 2014-05-12 MED ORDER — AMOXICILLIN-POT CLAVULANATE 875-125 MG PO TABS: 1.0000 | ORAL_TABLET | Freq: Two times a day (BID) | ORAL | Status: DC

## 2014-05-12 NOTE — Patient Instructions (Signed)
I have prescribed an antibiotic to be taken twice a day for 10 days. Take all doses with a meal or a snack.  Get an over-the-counter antihistamine; Allegra brand or the generic (less costly) is a good choice. Take this medication daily.  Get an over-the-counter saline nasal mist- AYR- and use it according to package directions.  i will see you back in 2-3 weeks for re-check.  Contact the clinic if you have any new symptoms.

## 2014-05-12 NOTE — Progress Notes (Signed)
Subjective:    Patient ID: Bryan Burke, male    DOB: October 12, 1980, 6[name] y.o.   MRN: [id number]  HPI  This 33 y.o. AA male is here for evaluation of enlarged lymph node per recommendation of his dentist. A nodule was palpated under the chin during a routine dental evaluation.  Pt feels well and c/o mild seasonal allergies. No OTC products used for PND and congestion. He has no hx of tobacco use. Pt denies abnormal weight loss, fatigue, fever/ chills, cough or night sweats. He does recall a cat-scratch on his R thigh by a friend's cat. This occurred several months ago.  Pt has Vitamin D def but stopped taking the supplement several months ago.  Pt traveled to [place] within the last 6 months. He did not contract any illness while there. He has felt well since his return.   Patient Active Problem List   Diagnosis Date Noted  . Family history of polycystic kidney 10/10/2013  . Snoring 10/09/2013  . Allergic rhinitis 10/09/2013    Prior to Admission medications   Medication Sig Start Date End Date Taking? Authorizing Provider  ergocalciferol (DRISDOL) 50000 UNITS capsule Take 1 capsule (50,000 Units total) by mouth once a week. 10/10/13 10/10/14  Maurice MarchBarbara B Dannell Raczkowski, MD    SOC and FAM Hx reviewed.   Review of Systems  Constitutional: Negative.   HENT: Positive for congestion and postnasal drip. Negative for dental problem, facial swelling, mouth sores, rhinorrhea, sinus pressure, sneezing, sore throat, trouble swallowing and voice change.   Eyes: Negative.   Respiratory: Negative.   Cardiovascular: Negative.   Gastrointestinal: Negative.   Musculoskeletal: Negative.   Skin: Negative.   Neurological: Negative.   Hematological: Does not bruise/bleed easily.  Psychiatric/Behavioral: Negative.       Objective:   Physical Exam  Nursing note and vitals reviewed. Constitutional: He is oriented to person, place, and time. He appears well-developed and well-nourished. No distress.    HENT:  Head: Normocephalic and atraumatic.  Right Ear: Hearing, tympanic membrane, external ear and ear canal normal.  Left Ear: Hearing, tympanic membrane, external ear and ear canal normal.  Nose: Mucosal edema present. No rhinorrhea, nasal deformity or septal deviation. Right sinus exhibits no maxillary sinus tenderness and no frontal sinus tenderness. Left sinus exhibits no maxillary sinus tenderness and no frontal sinus tenderness.  Mouth/Throat: Uvula is midline and mucous membranes are normal. No oral lesions. Normal dentition. No uvula swelling. Posterior oropharyngeal erythema present. No oropharyngeal exudate.  Eyes: Conjunctivae and EOM are normal. Pupils are equal, round, and reactive to light. No scleral icterus.  Neck: Trachea normal, normal range of motion and phonation normal. Neck supple. No spinous process tenderness present. No mass and no thyromegaly present.    Cardiovascular: Normal rate, regular rhythm and normal heart sounds.   No murmur heard. Pulmonary/Chest: Effort normal and breath sounds normal. No respiratory distress.  Musculoskeletal: Normal range of motion. He exhibits no edema and no tenderness.  Lymphadenopathy:       Head (right side): Submental adenopathy present. No submandibular, no tonsillar, no posterior auricular and no occipital adenopathy present.       Head (left side): No submental, no submandibular, no tonsillar, no posterior auricular and no occipital adenopathy present.    He has no cervical adenopathy.       Right: No supraclavicular adenopathy present.       Left: No supraclavicular adenopathy present.  Neurological: He is alert and oriented to person, place, and time.  No cranial nerve deficit.  Skin: Skin is warm and dry. No rash noted. He is not diaphoretic. No erythema.       Assessment & Plan:  Enlarged submental lymph node - Nodule in submental area but could be a blocked salivary gland (midlone nodule). Trial antibiotic and recheck  in 2 weeks. If not resolved, consider ENT referral. Pt agrees.   Plan: CBC with Differential  Allergic rhinitis, unspecified allergic rhinitis type- Trial antihistamine and saline nasal spray.  Vitamin D deficiency - Advised pt to resume Vit D supplement. Plan: Vit D  25 hydroxy   Meds ordered this encounter  Medications  . amoxicillin-clavulanate (AUGMENTIN) 875-125 MG per tablet    Sig: Take 1 tablet by mouth 2 (two) times daily.    Dispense:  20 tablet    Refill:  0

## 2014-05-13 LAB — CBC WITH DIFFERENTIAL/PLATELET
BASOS ABS: 0 10*3/uL (ref 0.0–0.2)
Basos: 1 %
Eos: 1 %
Eosinophils Absolute: 0.1 10*3/uL (ref 0.0–0.4)
HEMATOCRIT: 48.5 % (ref 37.5–51.0)
HEMOGLOBIN: 16.4 g/dL (ref 12.6–17.7)
Immature Grans (Abs): 0 10*3/uL (ref 0.0–0.1)
Immature Granulocytes: 0 %
LYMPHS ABS: 1.4 10*3/uL (ref 0.7–3.1)
Lymphs: 33 %
MCH: 29.3 pg (ref 26.6–33.0)
MCHC: 33.8 g/dL (ref 31.5–35.7)
MCV: 87 fL (ref 79–97)
MONOCYTES: 9 %
MONOS ABS: 0.4 10*3/uL (ref 0.1–0.9)
NEUTROS ABS: 2.3 10*3/uL (ref 1.4–7.0)
Neutrophils Relative %: 56 %
RBC: 5.59 x10E6/uL (ref 4.14–5.80)
RDW: 13.1 % (ref 12.3–15.4)
WBC: 4.2 10*3/uL (ref 3.4–10.8)

## 2014-05-13 LAB — VITAMIN D 25 HYDROXY (VIT D DEFICIENCY, FRACTURES): Vit D, 25-Hydroxy: 17.9 ng/mL — ABNORMAL LOW (ref 30.0–100.0)

## 2014-05-26 ENCOUNTER — Encounter: Payer: Self-pay | Admitting: Family Medicine

## 2014-05-26 ENCOUNTER — Ambulatory Visit (INDEPENDENT_AMBULATORY_CARE_PROVIDER_SITE_OTHER): Payer: 59 | Admitting: Family Medicine

## 2014-05-26 VITALS — BP 120/90 | HR 79 | Temp 98.4°F | Resp 16 | Ht 68.0 in | Wt 176.4 lb

## 2014-05-26 DIAGNOSIS — E559 Vitamin D deficiency, unspecified: Secondary | ICD-10-CM

## 2014-05-26 DIAGNOSIS — K118 Other diseases of salivary glands: Secondary | ICD-10-CM

## 2014-05-26 NOTE — Patient Instructions (Signed)
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 We can do an ultrasound to see if the stone can be seen and we can watch to see if the condition worsens. Typically, there is not further treatment if you are not have problems.

## 2014-05-26 NOTE — Progress Notes (Signed)
   Subjective:    Patient ID: Bryan Burke, male    DOB: 1981-05-26, 33 y.o.   MRN: [id number]  HPI This 33 y.o. AA male returns for recheck of nodule under chin; he was diagnosed w/ an enlarged submental lymph node on 05/12/2014. Antibiotic (Augmentin) was prescribed; pt completed  This medication and has resumed Vitamin D for severe deficiency. He reports the nodule is not painful and does not bother him when he eats. Pt denies sore throat or difficulty swallowing. His appetite has been good and weight is stable. No reported cough or SOB or GI complaints.  Patient Active Problem List   Diagnosis Date Noted  . Family history of polycystic kidney 10/10/2013  . Snoring 10/09/2013  . Allergic rhinitis 10/09/2013    Prior to Admission medications   Medication Sig Start Date End Date Taking? Authorizing Provider  ergocalciferol (DRISDOL) 50000 UNITS capsule Take 1 capsule (50,000 Units total) by mouth once a week. 10/10/13 10/10/14 Yes Maurice MarchBarbara B [place] Bridge, MD    History   Social History  . Marital Status: Single    Spouse Name: N/A    Number of Children: N/A  . Years of Education: N/A   Occupational History  . Not on file.   Social History Main Topics  . Smoking status: Never Smoker   . Smokeless tobacco: Not on file  . Alcohol Use: 3.6 oz/week    6 Cans of beer per week  . Drug Use: No  . Sexual Activity: Not on file   Other Topics Concern  . Not on file   Social History Narrative  . No narrative on file    Review of Systems  Constitutional: Negative for fever, appetite change and unexpected weight change.  HENT: Negative.   Respiratory: Negative for choking and shortness of breath.   Cardiovascular: Negative.   Gastrointestinal: Negative.   Skin: Negative.   Neurological: Negative.       Objective:   Physical Exam  Nursing note and vitals reviewed. Constitutional: He is oriented to person, place, and time. He appears well-developed and well-nourished. No distress.    HENT:  Head: Normocephalic and atraumatic.  Right Ear: Hearing, tympanic membrane, external ear and ear canal normal.  Left Ear: Hearing, tympanic membrane, external ear and ear canal normal.  Nose: Nose normal. No nasal deformity or septal deviation.  Mouth/Throat: Uvula is midline and mucous membranes are normal. No oral lesions. Normal dentition. No uvula swelling. Posterior oropharyngeal erythema present. No oropharyngeal exudate.  Neck: Trachea normal, normal range of motion and phonation normal. Neck supple. No spinous process tenderness and no muscular tenderness present. No mass and no thyromegaly present.    Cardiovascular: Normal rate and regular rhythm.   Pulmonary/Chest: Effort normal. No respiratory distress.  Neurological: He is alert and oriented to person, place, and time. No cranial nerve deficit.  Skin: Skin is warm and dry. No rash noted. He is not diaphoretic.       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 He is instructed to return if condition worsens. If this is a blocked gland, there is no further treatment; he could he referred to Burke for consultation. Plan: US Soft Tissue Head/Neck  Vitamin D def- Encouraged pt to continue weekly Vitamin D supplement and to get sun exposure throughout the winter.

## 2014-06-01 ENCOUNTER — Ambulatory Visit
Admission: RE | Admit: 2014-06-01 | Discharge: 2014-06-01 | Disposition: A | Discharge status: Home / Self Care | Payer: 59 | Source: Ambulatory Visit | Attending: Family Medicine | Admitting: Family Medicine

## 2014-06-01 DIAGNOSIS — K118 Other diseases of salivary glands: Secondary | ICD-10-CM

## 2014-09-01 ENCOUNTER — Encounter: Payer: Self-pay | Admitting: Family Medicine

## 2014-09-01 ENCOUNTER — Ambulatory Visit (INDEPENDENT_AMBULATORY_CARE_PROVIDER_SITE_OTHER): Payer: 59 | Admitting: Family Medicine

## 2014-09-01 VITALS — BP 118/86 | HR 80 | Temp 98.3°F | Resp 16 | Ht 68.0 in | Wt 180.0 lb

## 2014-09-01 DIAGNOSIS — E559 Vitamin D deficiency, unspecified: Secondary | ICD-10-CM

## 2014-09-01 DIAGNOSIS — Z23 Encounter for immunization: Secondary | ICD-10-CM

## 2014-09-01 DIAGNOSIS — R590 Localized enlarged lymph nodes: Secondary | ICD-10-CM

## 2014-09-01 DIAGNOSIS — R599 Enlarged lymph nodes, unspecified: Secondary | ICD-10-CM

## 2014-09-01 DIAGNOSIS — Z8739 Personal history of other diseases of the musculoskeletal system and connective tissue: Secondary | ICD-10-CM

## 2014-09-01 NOTE — Patient Instructions (Addendum)
I have ordered a referral to Dr. Althea CharonMckinley at one of the local Orthopedic practices. As we discussed, it is important for you to have current evaluation post- scoliosis surgery and get expert advise about what activity level is safe for you.  Continue taking Vitamin D supplement once a week.  The nodule under your chin feels half the size as before; if you decide that you want a definitive diagnosis, that will require a referral to a specialist who can evaluate and advise about further testing or biopsy.

## 2014-09-01 NOTE — Progress Notes (Signed)
Subjective:    Patient ID: Bryan Burke, male    DOB: [birth year], 34 y.o.   MRN: [id number]  HPI  Pt is here today for CPE but last exam was less than 12 months ago. Pt has started an exercise program which includes MMA training. He has concerns about back injury and impact that can occur while training (body slams, etc.) He had scoliosis surgery at age 34 with no follow-up in > 10 years. He does not report severe back pain or neurologic symptoms.  Follow-up re: nodule under chin- this has decreased in size and is not painful or tender. No problems w/ chewing or swallowing.  We discussed further eval and I recommended ENT w/ goal being biopsy for definitive diagnosis. Pt declines at this time and is comfortable monitoring this.  Pt has Vitamin D def; he has not taken supplement (50000 units) weekly. The deficiency was diagnosed in March 2015 and level in Oct 2015 was 17.9.  Patient Active Problem List   Diagnosis Date Noted  . Family history of polycystic kidney 10/10/2013  . Snoring 10/09/2013  . Allergic rhinitis 10/09/2013    Prior to Admission medications   Medication Sig Start Date End Date Taking? Authorizing Provider  ergocalciferol (DRISDOL) 50000 UNITS capsule Take 1 capsule (50,000 Units total) by mouth once a week. 10/10/13 10/10/14 Yes Maurice MarchBarbara B Aleera Gilcrease, MD   Past Surgical History  Procedure Laterality Date  . Spine surgery  1997-Scoliosis    [facility]    Review of Systems  Constitutional: Positive for activity change.  HENT: Negative.   Eyes: Negative.   Respiratory: Negative.   Cardiovascular: Negative.   Gastrointestinal: Negative.   Endocrine: Negative.   Genitourinary: Negative.   Musculoskeletal: Negative.   Skin: Negative.   Allergic/Immunologic: Negative.   Neurological: Negative.   Hematological: Positive for adenopathy.  Psychiatric/Behavioral: Negative.        Objective:   Physical Exam  Constitutional: He is oriented to person, place, and time. He  appears well-developed and well-nourished. No distress.  HENT:  Head: Normocephalic and atraumatic.  Right Ear: Hearing, tympanic membrane, external ear and ear canal normal.  Left Ear: Hearing, tympanic membrane, external ear and ear canal normal.  Nose: Nose normal. No nasal deformity or septal deviation.  Mouth/Throat: Uvula is midline, oropharynx is clear and moist and mucous membranes are normal. No oral lesions. Normal dentition. No dental caries. No oropharyngeal exudate.  Eyes: Conjunctivae and EOM are normal. Pupils are equal, round, and reactive to light. No scleral icterus.  Neck: Normal range of motion and full passive range of motion without pain. Neck supple. No spinous process tenderness and no muscular tenderness present. No thyroid mass and no thyromegaly present.  Cardiovascular: Normal rate, regular rhythm, normal heart sounds and intact distal pulses.  Exam reveals no gallop and no friction rub.   No murmur heard. Pulmonary/Chest: Effort normal and breath sounds normal. No respiratory distress.  Abdominal: Soft. Bowel sounds are normal.  Musculoskeletal: Normal range of motion. He exhibits no edema or tenderness.       Cervical back: Normal.       Thoracic back: He exhibits deformity.       Lumbar back: He exhibits deformity.  Well healed midline scar starting between scapulae, extending down to lumbar level.  Neurological: He is alert and oriented to person, place, and time. He has normal reflexes. No cranial nerve deficit. He exhibits normal muscle tone. Coordination normal.  Skin: Skin is warm and dry. He  is not diaphoretic.  Psychiatric: He has a normal mood and affect. His behavior is normal. Judgment and thought content normal.  Nursing note and vitals reviewed.      Assessment & Plan:  Personal history of scoliosis - Advised activity restrictions until evaluated by [facility]. Plan: Ambulatory referral to Orthopedic Surgery  Vitamin D deficiency- Take supplement  weekly and increase Vitamin D foods and sunlight exposure.  Enlarged submental lymph node- Continue to monitor; contact clinic if further ENT evaluation desired.  Need for Tdap vaccination - Plan: Tdap vaccine greater than or equal to 7yo IM

## 2014-09-23 ENCOUNTER — Ambulatory Visit (INDEPENDENT_AMBULATORY_CARE_PROVIDER_SITE_OTHER): Payer: 59 | Admitting: Family Medicine

## 2014-09-23 ENCOUNTER — Ambulatory Visit (INDEPENDENT_AMBULATORY_CARE_PROVIDER_SITE_OTHER): Payer: 59

## 2014-09-23 VITALS — BP 120/94 | HR 79 | Temp 99.1°F | Resp 16 | Ht 68.0 in | Wt 184.0 lb

## 2014-09-23 DIAGNOSIS — R109 Unspecified abdominal pain: Secondary | ICD-10-CM

## 2014-09-23 DIAGNOSIS — R1011 Right upper quadrant pain: Secondary | ICD-10-CM

## 2014-09-23 DIAGNOSIS — K5900 Constipation, unspecified: Secondary | ICD-10-CM

## 2014-09-23 MED ORDER — POLYETHYLENE GLYCOL 3350 17 GM/SCOOP PO POWD: ORAL | Status: DC

## 2014-09-23 NOTE — Progress Notes (Signed)
  Julian ReilBrian Hathaway - 34 y.o. male MRN [id number]  Date of birth: 08-11-1980  SUBJECTIVE: CC: Right sided flank pain  HPI: Insidious onset, began approximately 5 days ago.  Intermittent in nature and reported as severe,   10 out of 10 pain that will cause him to double over in pain when it occurs.  Spontaneously resolves. No specific eliciting features. Seems to resolve spontaneously but does improve with laying down.  Worse with prolonged standing at work, works at [employer]  ROS: Denies fevers, chills, nausea or vomiting. Denies any back pain, lower extremity numbness, tingling or radicular component. No radiation to the groin or axilla. No difficulty taking a deep breath.  HISTORY:  Past Medical, Surgical, Social, and Family History reviewed & updated per EMR.  Pertinent Historical Findings include:  reports that he has never smoked. He does not have any smokeless tobacco history on file. Sx for Scliosis in 1997 at [facility]   OBJECTIVE:  VS:   HT:5\' 8"  (172.7 cm)   WT:184 lb (83.462 kg)  BMI:28          BP:(!) 120/94 mmHg  HR:79bpm  TEMP:99.1 F (37.3 C)(Oral)  RESP:95 %  PHYSICAL EXAM:  Physical Exam  Constitutional: He is oriented to person, place, and time and well-developed, well-nourished, and in no distress. No distress.  HENT:  Head: Normocephalic and atraumatic.  Mouth/Throat: No oropharyngeal exudate.  Eyes: Right eye exhibits no discharge. Left eye exhibits no discharge. No scleral icterus.  Neck: No JVD present. No tracheal deviation present. No thyromegaly present.  Cardiovascular: Normal rate and regular rhythm.  Exam reveals no gallop and no friction rub.   No murmur heard. Pulmonary/Chest: Effort normal. No respiratory distress. He has no wheezes.  Abdominal: Soft. He exhibits distension. He exhibits no mass. Bowel sounds are hyperactive. Bowel sounds are not tinkling. There is generalized tenderness. There is no rigidity, no rebound, no guarding, no CVA tenderness,  no tenderness at McBurney's point and negative Murphy's sign. No hernia.  Musculoskeletal: He exhibits no edema or tenderness.  Lymphadenopathy:    He has no cervical adenopathy.  Neurological: He is alert and oriented to person, place, and time.  Skin: Skin is warm and dry. No rash noted. He is not diaphoretic. No erythema. No pallor.  Psychiatric: Mood, memory, affect and judgment normal.  Vitals reviewed.   DATA OBTAINED DURING VISIT: Primary Read of Imaging by: Dr. Neva SeatGreene & Dr. Berline Choughigby KUB:  09/24/2014  Findings: Marked gaseous distention with increased stool burden without evidence of air-fluid levels.   No free air.   Impression: The above findings are consistent with chronic constipation     ASSESSMENT: 1. Constipation, unspecified constipation type   2. Acute right flank pain    colicky type pain consistent with constipation. Negative Murphy's. No prior renal colic in no radiation to groin suggests against nephrolithiasis. If not improving would need further evaluation for ston.  PLAN: See problem based charting & AVS for additional documentation.  Modified bowel regimen with daily MiraLAX  Red flags reviewed > Return if symptoms worsen or fail to improve.

## 2014-09-23 NOTE — Patient Instructions (Signed)
Abdominal Pain °Many things can cause abdominal pain. Usually, abdominal pain is not caused by a disease and will improve without treatment. It can often be observed and treated at home. Your health care provider will do a physical exam and possibly order blood tests and X-rays to help determine the seriousness of your pain. However, in many cases, more time must pass before a clear cause of the pain can be found. Before that point, your health care provider may not know if you need more testing or further treatment. °HOME CARE INSTRUCTIONS  °Monitor your abdominal pain for any changes. The following actions may help to alleviate any discomfort you are experiencing: °· Only take over-the-counter or prescription medicines as directed by your health care provider. °· Do not take laxatives unless directed to do so by your health care provider. °· Try a clear liquid diet (broth, tea, or water) as directed by your health care provider. Slowly move to a bland diet as tolerated. °SEEK MEDICAL CARE IF: °· You have unexplained abdominal pain. °· You have abdominal pain associated with nausea or diarrhea. °· You have pain when you urinate or have a bowel movement. °· You experience abdominal pain that wakes you in the night. °· You have abdominal pain that is worsened or improved by eating food. °· You have abdominal pain that is worsened with eating fatty foods. °· You have a fever. °SEEK IMMEDIATE MEDICAL CARE IF:  °· Your pain does not go away within 2 hours. °· You keep throwing up (vomiting). °· Your pain is felt only in portions of the abdomen, such as the right side or the left lower portion of the abdomen. °· You pass bloody or black tarry stools. °MAKE SURE YOU: °· Understand these instructions.   °· Will watch your condition.   °· Will get help right away if you are not doing well or get worse.   °Document Released: 05/02/2005 Document Revised: 07/28/2013 Document Reviewed: 04/01/2013 °ExitCare® Patient Information  ©2015 [employer]. This information is not intended to replace advice given to you by your health care provider. Make sure you discuss any questions you have with your health care provider. ° °Constipation °Constipation is when a person has fewer than three bowel movements a week, has difficulty having a bowel movement, or has stools that are dry, hard, or larger than normal. As people grow older, constipation is more common. If you try to fix constipation with medicines that make you have a bowel movement (laxatives), the problem may get worse. Long-term laxative use may cause the muscles of the colon to become weak. A low-fiber diet, not taking in enough fluids, and taking certain medicines may make constipation worse.  °CAUSES  °· Certain medicines, such as antidepressants, pain medicine, iron supplements, antacids, and water pills.   °· Certain diseases, such as diabetes, irritable bowel syndrome (IBS), thyroid disease, or depression.   °· Not drinking enough water.   °· Not eating enough fiber-rich foods.   °· Stress or travel.   °· Lack of physical activity or exercise.   °· Ignoring the urge to have a bowel movement.   °· Using laxatives too much.   °SIGNS AND SYMPTOMS  °· Having fewer than three bowel movements a week.   °· Straining to have a bowel movement.   °· Having stools that are hard, dry, or larger than normal.   °· Feeling full or bloated.   °· Pain in the lower abdomen.   °· Not feeling relief after having a bowel movement.   °DIAGNOSIS  °Your health care provider will take   a medical history and perform a physical exam. Further testing may be done for severe constipation. Some tests may include: °· A barium enema X-ray to examine your rectum, colon, and, sometimes, your small intestine.   °· A sigmoidoscopy to examine your lower colon.   °· A colonoscopy to examine your entire colon. °TREATMENT  °Treatment will depend on the severity of your constipation and what is causing it. Some dietary  treatments include drinking more fluids and eating more fiber-rich foods. Lifestyle treatments may include regular exercise. If these diet and lifestyle recommendations do not help, your health care provider may recommend taking over-the-counter laxative medicines to help you have bowel movements. Prescription medicines may be prescribed if over-the-counter medicines do not work.  °HOME CARE INSTRUCTIONS  °· Eat foods that have a lot of fiber, such as fruits, vegetables, whole grains, and beans. °· Limit foods high in fat and processed sugars, such as french fries, hamburgers, cookies, candies, and soda.   °· A fiber supplement may be added to your diet if you cannot get enough fiber from foods.   °· Drink enough fluids to keep your urine clear or pale yellow.   °· Exercise regularly or as directed by your health care provider.   °· Go to the restroom when you have the urge to go. Do not hold it.   °· Only take over-the-counter or prescription medicines as directed by your health care provider. Do not take other medicines for constipation without talking to your health care provider first.   °SEEK IMMEDIATE MEDICAL CARE IF:  °· You have bright red blood in your stool.   °· Your constipation lasts for more than 4 days or gets worse.   °· You have abdominal or rectal pain.   °· You have thin, pencil-like stools.   °· You have unexplained weight loss. °MAKE SURE YOU:  °· Understand these instructions. °· Will watch your condition. °· Will get help right away if you are not doing well or get worse. °Document Released: 04/20/2004 Document Revised: 07/28/2013 Document Reviewed: 05/04/2013 °ExitCare® Patient Information ©2015 [employer]. This information is not intended to replace advice given to you by your health care provider. Make sure you discuss any questions you have with your health care provider. ° °

## 2014-09-24 NOTE — Progress Notes (Signed)
Xray read and patient discussed with Dr. Rigby. Agree with assessment and plan of care per his note.   

## 2014-10-25 ENCOUNTER — Other Ambulatory Visit: Payer: Self-pay | Admitting: Family Medicine

## 2015-04-10 IMAGING — US US SOFT TISSUE HEAD/NECK
1 series · 11 of 11 positions shown · non-contrast
Comparison: None.

CLINICAL DATA: Palpable abnormality below chin submandibular
region. Rule out salivary duct stone

EXAM:
ULTRASOUND OF HEAD/NECK SOFT TISSUES
TECHNIQUE: Ultrasound examination of the head and neck soft tissues was
performed in the area of clinical concern.

[Series 1: us soft tissue head/neck · 0.08mm/px · 11 of 11 slices shown]
[im 1/11]
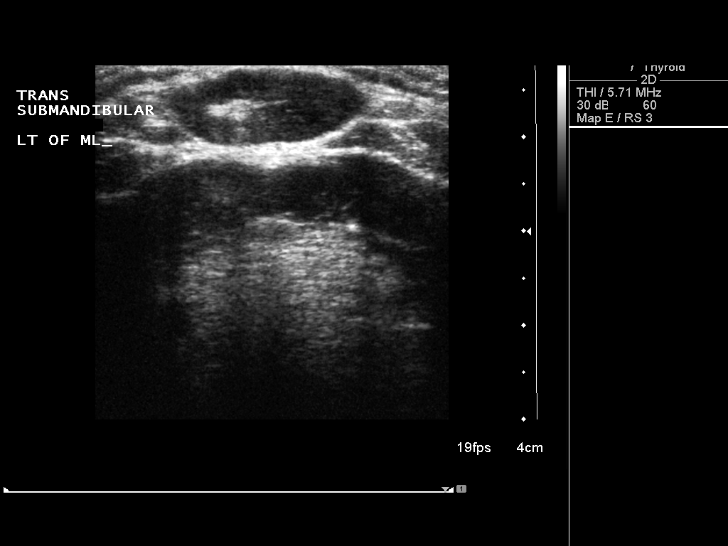
[im 2/11]
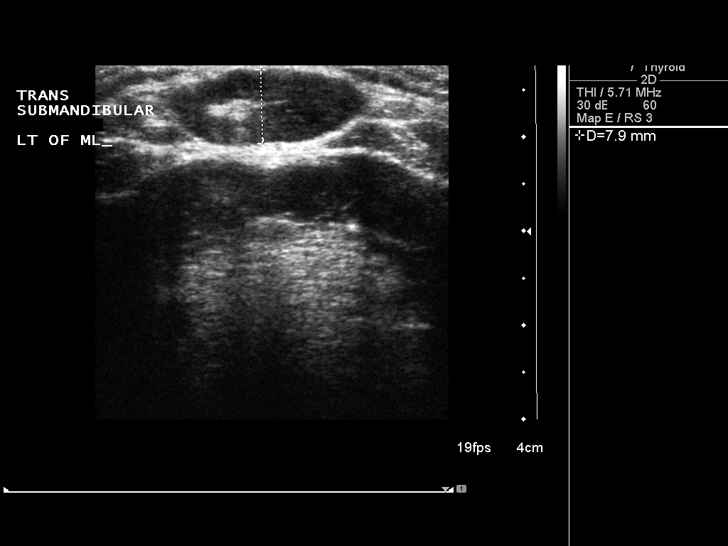
[im 3/11]
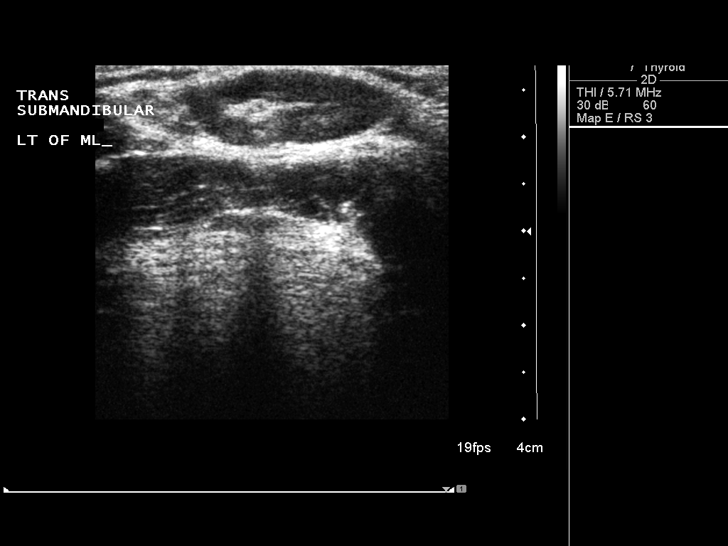
[im 4/11]
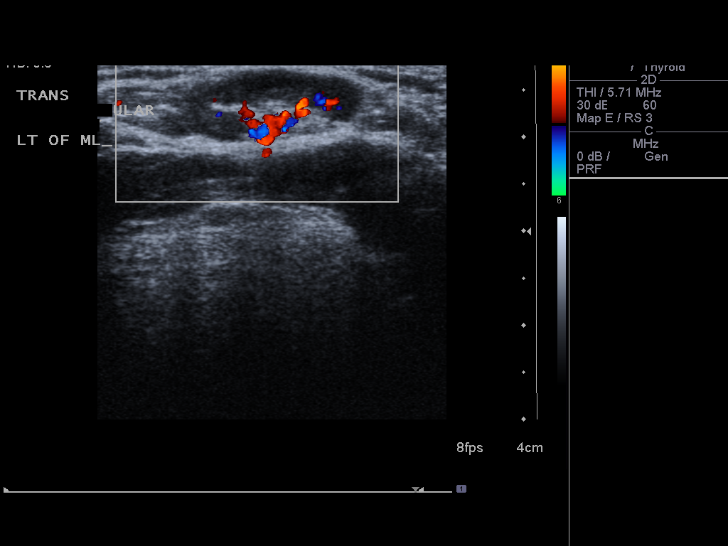
[im 5/11]
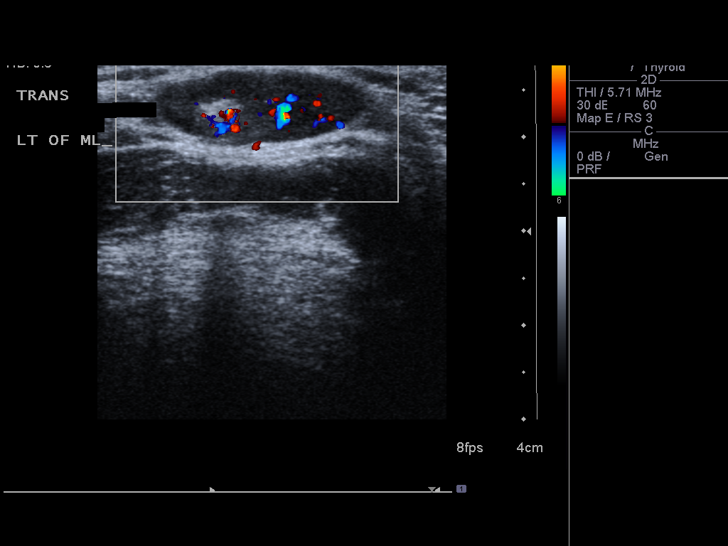
[im 6/11]
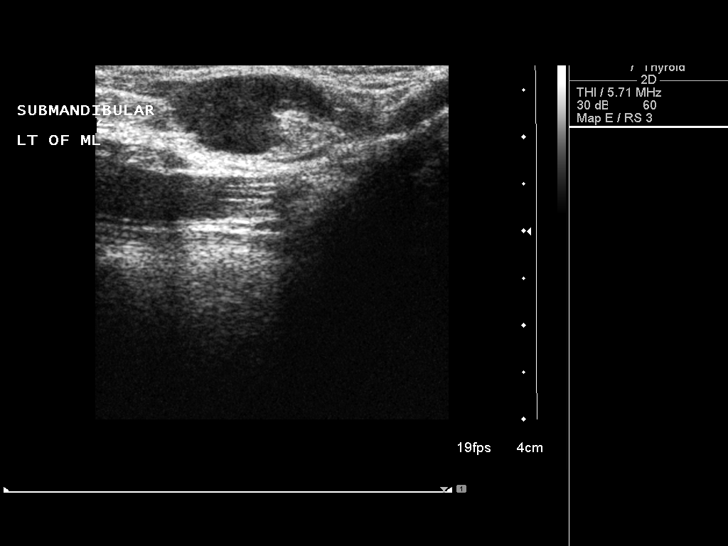
[im 7/11]
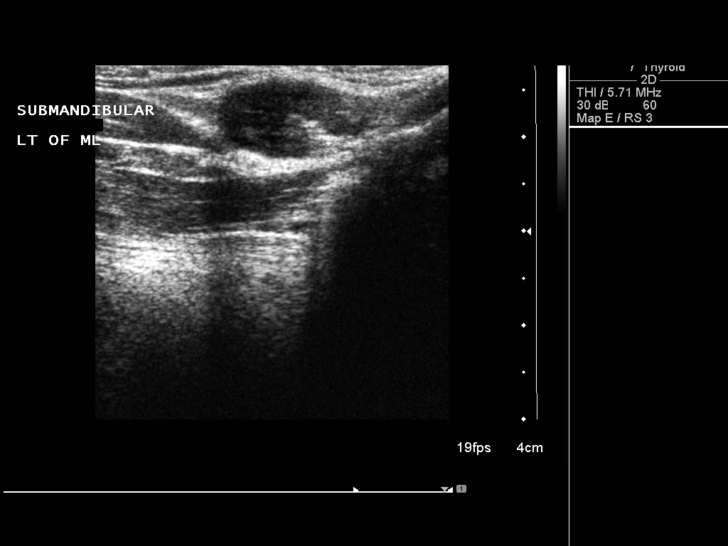
[im 8/11]
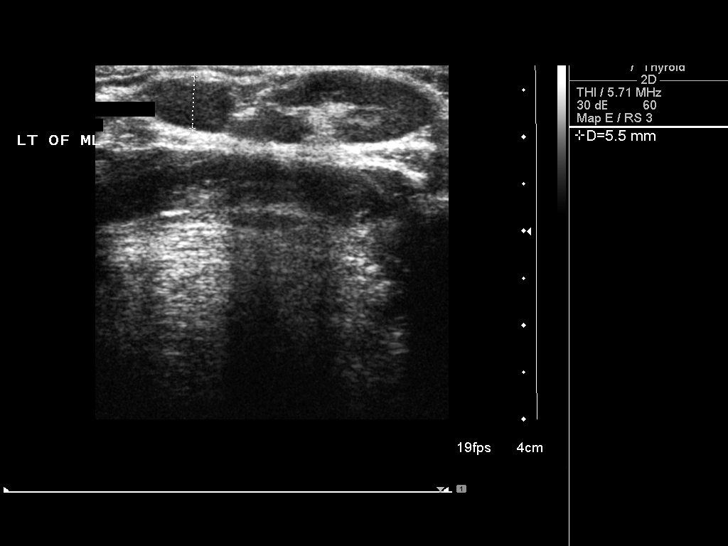
[im 9/11]
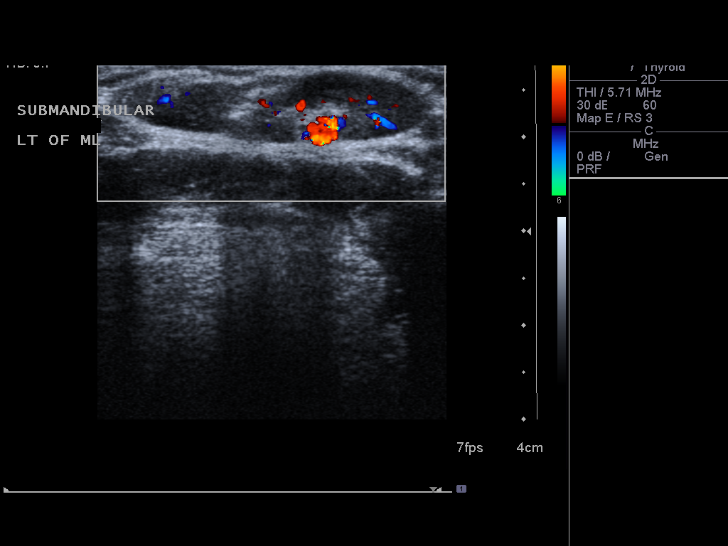
[im 10/11]
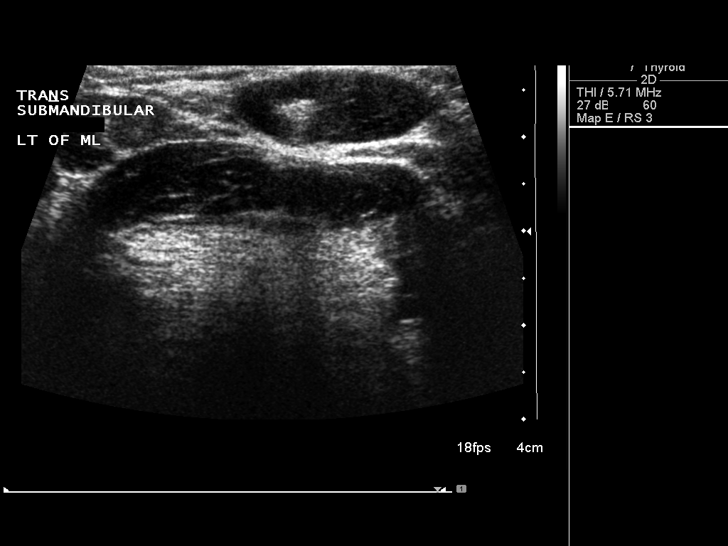
[im 11/11]
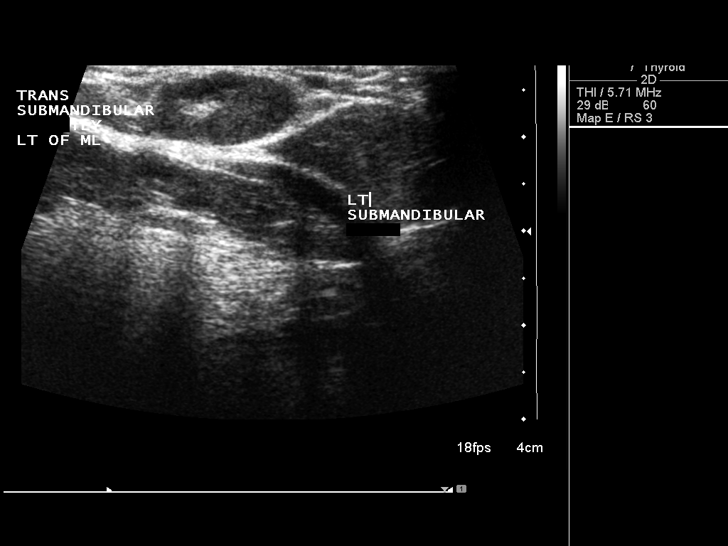

[11 of 11 positions shown; findings below may reference images not displayed]

FINDINGS: Palpable abnormality corresponds to subcutaneous lymph node
measuring 8 mm short axis. This is hypoechoic with a fatty hilum and
appears benign. Adjacent smaller lymph node measuring 6 mm. These
are both palpable.
IMPRESSION: Submandibular lymph nodes correspond to the palpable abnormality.
These appear benign, however if these do not resolve with
conservative treatment, biopsy is suggested.

## 2015-05-16 ENCOUNTER — Ambulatory Visit (INDEPENDENT_AMBULATORY_CARE_PROVIDER_SITE_OTHER): Payer: 59 | Admitting: Physician Assistant

## 2015-05-16 VITALS — BP 132/90 | HR 81 | Temp 99.3°F | Resp 18 | Ht 67.75 in | Wt 185.4 lb

## 2015-05-16 DIAGNOSIS — K611 Rectal abscess: Secondary | ICD-10-CM | POA: Diagnosis not present

## 2015-05-16 MED ORDER — HYDROCODONE-ACETAMINOPHEN 5-325 MG PO TABS: 1.0000 | ORAL_TABLET | Freq: Four times a day (QID) | ORAL | Status: AC | PRN

## 2015-05-16 MED ORDER — SULFAMETHOXAZOLE-TRIMETHOPRIM 800-160 MG PO TABS: 1.0000 | ORAL_TABLET | Freq: Two times a day (BID) | ORAL | Status: DC

## 2015-05-16 NOTE — Patient Instructions (Addendum)
Please take warm sitz bath to soak the wound.  Do at least 3 times per day for 15 minutes.  You can take the norco for pain, but tylenol or ibuprofen may be all you need for pain after a few short days.  You may want a pillow or cushioning, for tomorrow at work.   Please take the antibiotic to completion.  I will have the result of the wound culture within the next 7 days.  Please look at alarming symptoms that warrant immediate return, below.    Perirectal Abscess An abscess is an infected area that contains a collection of pus. A perirectal abscess is an abscess that is near the opening of the anus or around the rectum. A perirectal abscess can cause a lot of pain, especially during bowel movements. CAUSES This condition is almost always caused by an infection that starts in an anal gland. RISK FACTORS This condition is more likely to develop in:  People with diabetes or inflammatory bowel disease.  People whose body defense system (immune system) is weak.  People who have anal sex.  People who have a sexually transmitted disease (STD).  People who have certain kinds of cancers, such as rectal carcinoma, leukemia, or lymphoma. SYMPTOMS The main symptom of this condition is pain. The pain may be a throbbing pain that gets worse during bowel movements. Other symptoms include:  Fever.  Swelling.  Redness.  Bleeding.  Constipation. DIAGNOSIS The condition is diagnosed with a physical exam. If the abscess is not visible, a health care provider may need to place a finger inside the rectum to find the abscess. Sometimes, imaging tests are done to determine the size and location of the abscess. These tests may include:  An ultrasound.  An MRI.  A CT scan. TREATMENT This condition is usually treated with incision and drainage surgery. Incision and drainage surgery involves making an incision over the abscess to drain the pus. Treatment may also involve antibiotic medicine, pain  medicine, stool softeners, or laxatives. HOME CARE INSTRUCTIONS  Take medicines only as directed by your health care provider.  If you were prescribed an antibiotic, finish all of it even if you start to feel better.  To relieve pain, try sitting:  In a warm, shallow bath (sitz bath).  On a heating pad with the setting on low.  On an inflatable donut-shaped cushion.  Follow any diet instructions as directed by your health care provider.  Keep all follow-up visits as directed by your health care provider. This is important. SEEK MEDICAL CARE IF:  Your abscess is bleeding.  You have pain, swelling, or redness that is getting worse.  You are constipated.  You feel ill.  You have muscle aches or chills.  You have a fever.  Your symptoms return after the abscess has healed.   This information is not intended to replace advice given to you by your health care provider. Make sure you discuss any questions you have with your health care provider.   Document Released: 07/20/2000 Document Revised: 04/13/2015 Document Reviewed: 06/02/2014 Elsevier Interactive Patient Education [employer]

## 2015-05-16 NOTE — Progress Notes (Signed)
[facility] [address], [place] [address] [id number]  Date:  05/16/2015   Name:  Bryan Burke   DOB:  [birth year]   MRN:  [id number]  PCP:  Dow Adolph, MD    History of Present Illness:  Bryan Burke is a 34 y.o. male patient who presents to [facility] for rectal pain for over 1 month that has progressively worsened.  He notes that he has had significant pain at his rectum, that is aggravated more by sitting.  He works at [employer].  He reports no hx of trauma.  He feels better with standing, but has more difficulty with squatting. He denies constipation, or pain with bowel movements.  No urinary frequency, dysuria, dribbling, or weak stream.  He denies fever, nausea, vomiting, or chills.     Patient Active Problem List   Diagnosis Date Noted  . Family history of polycystic kidney 10/10/2013  . Snoring 10/09/2013  . Allergic rhinitis 10/09/2013    Past Medical History  Diagnosis Date  . Scoliosis   . Allergy     Past Surgical History  Procedure Laterality Date  . Spine surgery  1997-Scoliosis    [facility]    Social History  Substance Use Topics  . Smoking status: Never Smoker   . Smokeless tobacco: None  . Alcohol Use: 1.2 - 1.8 oz/week    2-3 Cans of beer per week    Family History  Problem Relation Age of Onset  . Cancer Father   . Kidney disease Brother 66    Polycystic kidney disease    No Known Allergies  Medication list has been reviewed and updated.  Current Outpatient Prescriptions on File Prior to Visit  Medication Sig Dispense Refill  . polyethylene glycol powder (GLYCOLAX/MIRALAX) powder Take 6 capfuls and mix with 32 oz of gatorade.  Consume over 1 hour Then use 1 capful daily in 8oz of water (Patient not taking: Reported on 05/16/2015) 850 g 1  . Vitamin D, Ergocalciferol, (DRISDOL) 50000 UNITS CAPS capsule TAKE ONE CAPSULE BY MOUTH EVERY WEEK (Patient not taking: Reported on 05/16/2015) 4 capsule 6   No current  facility-administered medications on file prior to visit.    ROS ROS otherwise unremarkable unless listed above.   Physical Examination: BP 132/90 mmHg  Pulse 81  Temp(Src) 99.3 F (37.4 C) (Oral)  Resp 18  Ht 5' 7.75" (1.721 m)  Wt 185 lb 6 oz (84.086 kg)  BMI 28.39 kg/m2  SpO2 98% Ideal Body Weight: Weight in (lb) to have BMI = 25: 162.9  Physical Exam  Constitutional: He is oriented to person, place, and time. He appears well-developed and well-nourished. No distress.  HENT:  Head: Normocephalic and atraumatic.  Eyes: Conjunctivae and EOM are normal. Pupils are equal, round, and reactive to light.  Cardiovascular: Normal rate.   Pulmonary/Chest: Effort normal. No respiratory distress.  Genitourinary: Rectal exam shows no mass and no tenderness.  Neurological: He is alert and oriented to person, place, and time.  Skin: Skin is warm and dry. He is not diaphoretic.  2am 1 inch from anus at right glute, is erythematous fluctuanct tender nodule consistent with an abscess.  There is tenderness surrounding site, but not along the adjacent gluteal site.  No exudate.    Psychiatric: He has a normal mood and affect. His behavior is normal.    Procedure: Verbal consent obtained.  Abscess site cleansed with alcohol swab.  1% lidocaine placed at the abscess site.  Procedure  site was then cleansed again with povidine.  11 blade inserted into abscess.  Purulent drainage amply expressed.  Cleansed with normal saline.    Assessment and Plan: Bryan Burke is a 34 y.o. male who is here today with rectal pain. -abscess drained successfully.  Advised both verbally and in handout of sitz baths, warm compresses, abx., and alarming sxs that warrant immediate follow up. -will cover with 3 days of controlled pain relief, then follow up with nsaids.   1. Perirectal abscess - sulfamethoxazole-trimethoprim (BACTRIM DS,SEPTRA DS) 800-160 MG tablet; Take 1 tablet by mouth 2 (two) times daily.  Dispense:  20 tablet; Refill: 0 - HYDROcodone-acetaminophen (NORCO) 5-325 MG tablet; Take 1 tablet by mouth every 6 (six) hours as needed.  Dispense: 12 tablet; Refill: 0 - Wound culture   Trena Platt, PA-C [facility] 05/16/2015 9:23 PM

## 2015-05-19 LAB — WOUND CULTURE
GRAM STAIN: NONE SEEN
Gram Stain: NONE SEEN

## 2015-05-22 ENCOUNTER — Encounter: Payer: Self-pay | Admitting: Family Medicine

## 2016-05-17 ENCOUNTER — Ambulatory Visit (INDEPENDENT_AMBULATORY_CARE_PROVIDER_SITE_OTHER): Payer: 59 | Admitting: Family Medicine

## 2016-05-17 VITALS — BP 122/90 | HR 76 | Temp 98.5°F | Resp 16 | Ht 68.0 in | Wt 181.8 lb

## 2016-05-17 DIAGNOSIS — G478 Other sleep disorders: Secondary | ICD-10-CM

## 2016-05-17 DIAGNOSIS — Z0000 Encounter for general adult medical examination without abnormal findings: Secondary | ICD-10-CM

## 2016-05-17 NOTE — Progress Notes (Signed)
   Bryan ReilBrian Burke is a 35 y.o. male who presents to [facility] today for comprehensive physical examination:  CPE:  Here for annual exam.  Concerns as below.  Concerns:  Patient states that he has trouble with sleep. States he has trouble awakening from sleep when he tries to fall asleep. Also will sometimes have nighttime awakenings during the night. Denies any snoring symptoms but does state as he wakes up gasping. Has never been evaluated for sleep apnea. States that a prior physician in the past told him he is to have this looked at but he never followed through. Denies any real sleepiness or narcolepsy symptoms during the day.  Last physical last year. Flu vaccine up to date. Eye exam never.;  Wears glasses and had these checked about 3 years ago.  No vision problems currently. Dental exam every six months.  ROS:  The patient denies fever, unusual weight change, decreased hearing, chest pain, palpitations, pre-syncopal or syncopal episodes, dyspnea on exertion, prolonged cough, hemoptysis, change in bowel habits, melena, hematochezia, severe indigestion/heartburn, nausea/vomiting/abdominal pain, genital sores, muscle weakness, difficulty walking, abnormal bleeding, or enlarged lymph nodes.     PMH reviewed. Patient is a nonsmoker.   Past Medical History:  Diagnosis Date  . Allergy   . Scoliosis    Past Surgical History:  Procedure Laterality Date  . SPINE SURGERY  1997-Scoliosis   [facility]    Medications reviewed. No current outpatient prescriptions on file.   No current facility-administered medications for this visit.     Exam: BP 122/90 (BP Location: Left Arm, Patient Position: Sitting, Cuff Size: Normal)   Pulse 76   Temp 98.5 F (36.9 C) (Oral)   Resp 16   Ht 5\' 8"  (1.727 m)   Wt 181 lb 12.8 oz (82.5 kg)   SpO2 97%   BMI 27.64 kg/m  Gen:  Alert, cooperative patient who appears stated age in no acute distress.  Vital signs reviewed. Head: [place]/AT.     Eyes:  EOMI, PERRL.   Ears:  External ears WNL, Bilateral TM's normal without retraction, redness or bulging. Nose:  Septum midline  Mouth:  MMM, tonsils non-erythematous, non-edematous.   Neck: No masses or thyromegaly or limitation in range of motion.  No cervical lymphadenopathy. Pulm:  Clear to auscultation bilaterally with good air movement.  No wheezes or rales noted.   Cardiac:  Regular rate and rhythm without murmur auscultated.  Good S1/S2. Abd:  Soft/nondistended/nontender.  Good bowel sounds throughout all four quadrants.  No masses noted.  Ext:  No clubbing/cyanosis/erythema.  No edema noted bilateral lower extremities.   Neuro:  Grossly normal, no gait abnormalities Psych:  Not depressed or anxious appearing.  Conversant and engaged  Impression/Plan: 1. Complete Physical Examination: anticipatory guidance provided.   4.  Screening cholesterol: Had a lipid panel done at work about 2 months ago. States she'll bring this up so we can scan it into the chart. 5.  Sleep: Patient is concerned about his sleep hemorrhoidectomy evaluated with a sleep specialist. We will place a referral today. No clear-cut diagnosis of sleep apnea or other sleep disorder.

## 2016-05-17 NOTE — Patient Instructions (Addendum)
It was good to meet you today!  Things look good.  We'll get you set up for a visit with a sleep specialist.  They will call you with an appointment.  Health Maintenance, Male A healthy lifestyle and preventative care can promote health and wellness.  Maintain regular health, dental, and eye exams.  Eat a healthy diet. Foods like vegetables, fruits, whole grains, low-fat dairy products, and lean protein foods contain the nutrients you need and are low in calories. Decrease your intake of foods high in solid fats, added sugars, and salt. Get information about a proper diet from your health care provider, if necessary.  Regular physical exercise is one of the most important things you can do for your health. Most adults should get at least 150 minutes of moderate-intensity exercise (any activity that increases your heart rate and causes you to sweat) each week. In addition, most adults need muscle-strengthening exercises on 2 or more days a week.   Maintain a healthy weight. The body mass index (BMI) is a screening tool to identify possible weight problems. It provides an estimate of body fat based on height and weight. Your health care provider can find your BMI and can help you achieve or maintain a healthy weight. For males 20 years and older:  A BMI below 18.5 is considered underweight.  A BMI of 18.5 to 24.9 is normal.  A BMI of 25 to 29.9 is considered overweight.  A BMI of 30 and above is considered obese.  Maintain normal blood lipids and cholesterol by exercising and minimizing your intake of saturated fat. Eat a balanced diet with plenty of fruits and vegetables. Blood tests for lipids and cholesterol should begin at age 33 and be repeated every 5 years. If your lipid or cholesterol levels are high, you are over age 33, or you are at high risk for heart disease, you may need your cholesterol levels checked more frequently.Ongoing high lipid and cholesterol levels should be  treated with medicines if diet and exercise are not working.  If you smoke, find out from your health care provider how to quit. If you do not use tobacco, do not start.  Lung cancer screening is recommended for adults aged 55-80 years who are at high risk for developing lung cancer because of a history of smoking. A yearly low-dose CT scan of the lungs is recommended for people who have at least a 30-pack-year history of smoking and are current smokers or have quit within the past 15 years. A pack year of smoking is smoking an average of 1 pack of cigarettes a day for 1 year (for example, a 30-pack-year history of smoking could mean smoking 1 pack a day for 30 years or 2 packs a day for 15 years). Yearly screening should continue until the smoker has stopped smoking for at least 15 years. Yearly screening should be stopped for people who develop a health problem that would prevent them from having lung cancer treatment.  If you choose to drink alcohol, do not have more than 2 drinks per day. One drink is considered to be 12 oz (360 mL) of beer, 5 oz (150 mL) of wine, or 1.5 oz (45 mL) of liquor.  Avoid the use of street drugs. Do not share needles with anyone. Ask for help if you need support or instructions about stopping the use of drugs.  High blood pressure causes heart disease and increases the risk of stroke. High blood pressure is more likely  to develop in:  People who have blood pressure in the end of the normal range (100-139/85-89 mm Hg).  People who are overweight or obese.  People who are African American.  If you are 76-50 years of age, have your blood pressure checked every 3-5 years. If you are 25 years of age or older, have your blood pressure checked every year. You should have your blood pressure measured twice--once when you are at a hospital or clinic, and once when you are not at a hospital or clinic. Record the average of the two measurements. To check your blood pressure  when you are not at a hospital or clinic, you can use:  An automated blood pressure machine at a pharmacy.  A home blood pressure monitor.  If you are 4-42 years old, ask your health care provider if you should take aspirin to prevent heart disease.  Diabetes screening involves taking a blood sample to check your fasting blood sugar level. This should be done once every 3 years after age 31 if you are at a normal weight and without risk factors for diabetes. Testing should be considered at a younger age or be carried out more frequently if you are overweight and have at least 1 risk factor for diabetes.  Colorectal cancer can be detected and often prevented. Most routine colorectal cancer screening begins at the age of 21 and continues through age 9. However, your health care provider may recommend screening at an earlier age if you have risk factors for colon cancer. On a yearly basis, your health care provider may provide home test kits to check for hidden blood in the stool. A small camera at the end of a tube may be used to directly examine the colon (sigmoidoscopy or colonoscopy) to detect the earliest forms of colorectal cancer. Talk to your health care provider about this at age 37 when routine screening begins. A direct exam of the colon should be repeated every 5-10 years through age 15, unless early forms of precancerous polyps or small growths are found.  People who are at an increased risk for hepatitis B should be screened for this virus. You are considered at high risk for hepatitis B if:  You were born in a country where hepatitis B occurs often. Talk with your health care provider about which countries are considered high risk.  Your parents were born in a high-risk country and you have not received a shot to protect against hepatitis B (hepatitis B vaccine).  You have HIV or AIDS.  You use needles to inject street drugs.  You live with, or have sex with, someone who has  hepatitis B.  You are a man who has sex with other men (MSM).  You get hemodialysis treatment.  You take certain medicines for conditions like cancer, organ transplantation, and autoimmune conditions.  Hepatitis C blood testing is recommended for all people born from [birth year] through 1963 and any individual with known risk factors for hepatitis C.  Healthy men should no longer receive prostate-specific antigen (PSA) blood tests as part of routine cancer screening. Talk to your health care provider about prostate cancer screening.  Testicular cancer screening is not recommended for adolescents or adult males who have no symptoms. Screening includes self-exam, a health care provider exam, and other screening tests. Consult with your health care provider about any symptoms you have or any concerns you have about testicular cancer.  Practice safe sex. Use condoms and avoid high-risk sexual practices to  reduce the spread of sexually transmitted infections (STIs).  You should be screened for STIs, including gonorrhea and chlamydia if:  You are sexually active and are younger than 24 years.  You are older than 24 years, and your health care provider tells you that you are at risk for this type of infection.  Your sexual activity has changed since you were last screened, and you are at an increased risk for chlamydia or gonorrhea. Ask your health care provider if you are at risk.  If you are at risk of being infected with HIV, it is recommended that you take a prescription medicine daily to prevent HIV infection. This is called pre-exposure prophylaxis (PrEP). You are considered at risk if:  You are a man who has sex with other men (MSM).  You are a heterosexual man who is sexually active with multiple partners.  You take drugs by injection.  You are sexually active with a partner who has HIV.  Talk with your health care provider about whether you are at high risk of being infected with HIV. If  you choose to begin PrEP, you should first be tested for HIV. You should then be tested every 3 months for as long as you are taking PrEP.  Use sunscreen. Apply sunscreen liberally and repeatedly throughout the day. You should seek shade when your shadow is shorter than you. Protect yourself by wearing long sleeves, pants, a wide-brimmed hat, and sunglasses year round whenever you are outdoors.  Tell your health care provider of new moles or changes in moles, especially if there is a change in shape or color. Also, tell your health care provider if a mole is larger than the size of a pencil eraser.  A one-time screening for abdominal aortic aneurysm (AAA) and surgical repair of large AAAs by ultrasound is recommended for men aged 65-75 years who are current or former smokers.  Stay current with your vaccines (immunizations).   This information is not intended to replace advice given to you by your health care provider. Make sure you discuss any questions you have with your health care provider.   Document Released: 01/19/2008 Document Revised: 08/13/2014 Document Reviewed: 12/18/2010 Elsevier Interactive Patient Education [employer]    IF you received an x-ray today, you will receive an invoice from [facility]. Please contact [facility] at [phone] with questions or concerns regarding your invoice.   IF you received labwork today, you will receive an invoice from [employer]. Please contact [facility] at [phone] with questions or concerns regarding your invoice.   Our billing staff will not be able to assist you with questions regarding bills from these companies.  You will be contacted with the lab results as soon as they are available. The fastest way to get your results is to activate your My Chart account. Instructions are located on the last page of this paperwork. If you have not heard from [place] regarding the results in 2 weeks,  please contact this office.

## 2016-08-09 ENCOUNTER — Ambulatory Visit (INDEPENDENT_AMBULATORY_CARE_PROVIDER_SITE_OTHER): Payer: 59 | Admitting: Physician Assistant

## 2016-08-09 VITALS — BP 122/80 | HR 101 | Temp 98.5°F | Resp 18 | Ht 68.0 in | Wt 181.0 lb

## 2016-08-09 DIAGNOSIS — B349 Viral infection, unspecified: Secondary | ICD-10-CM

## 2016-08-09 DIAGNOSIS — B309 Viral conjunctivitis, unspecified: Secondary | ICD-10-CM

## 2016-08-09 DIAGNOSIS — R0981 Nasal congestion: Secondary | ICD-10-CM

## 2016-08-09 DIAGNOSIS — R05 Cough: Secondary | ICD-10-CM

## 2016-08-09 DIAGNOSIS — R059 Cough, unspecified: Secondary | ICD-10-CM

## 2016-08-09 MED ORDER — FLUTICASONE PROPIONATE 50 MCG/ACT NA SUSP: 2.0000 | Freq: Every day | NASAL | 6 refills | Status: AC

## 2016-08-09 MED ORDER — EPINASTINE HCL 0.05 % OP SOLN: 1.0000 [drp] | Freq: Two times a day (BID) | OPHTHALMIC | 1 refills | Status: AC

## 2016-08-09 MED ORDER — PSEUDOEPHEDRINE HCL ER 120 MG PO TB12: 120.0000 mg | ORAL_TABLET | Freq: Two times a day (BID) | ORAL | 0 refills | Status: AC

## 2016-08-09 NOTE — Patient Instructions (Addendum)
Apply 1-2 drops each eye as directed. See below for more information about conjunctivitis.  Flonase: 2 sprays each nostril in the morning and at night.  Sudafed: Take one pill every 12 hours.  Drink plenty of fluids and rest.   Thank you for coming in today. I hope you feel we met your needs.  Feel free to call [facility] if you have any questions or further requests.  Please consider signing up for MyChart if you do not already have it, as this is a great way to communicate with me.  Best,  Whitney McVey, PA-C   Viral Conjunctivitis, Adult Viral conjunctivitis is an inflammation of the clear membrane that covers the white part of your eye and the inner surface of your eyelid (conjunctiva). The inflammation is caused by a viral infection. The blood vessels in the conjunctiva become inflamed, causing the eye to become red or pink, and often itchy. Viral conjunctivitis can be easily passed from one person to another (is contagious). This condition is often called pink eye. What are the causes? This condition is caused by a virus. A virus is a type of contagious germ. It can be spread by touching objects that have been contaminated with the virus, such as doorknobs or towels. It can also be passed through droplets, such as from coughing or sneezing. What are the signs or symptoms? Symptoms of this condition include:  Eye redness.  Tearing or watery eyes.  Itchy and irritated eyes.  Burning feeling in the eyes.  Clear drainage from the eye.  Swollen eyelids.  A gritty feeling in the eye.  Light sensitivity. This condition often occurs with other symptoms, such as a fever, nausea, or a rash. How is this diagnosed? This condition is diagnosed with a medical history and physical exam. If you have discharge from your eye, the discharge may be tested to rule out other causes of conjunctivitis. How is this treated? Viral conjunctivitis does not respond to medicines that kill bacteria  (antibiotics). Treatment for viral conjunctivitis is directed at stopping a bacterial infection from developing in addition to the viral infection. Treatment also aims to relieve your symptoms, such as itching. This may be done with antihistamine drops or other eye medicines. Rarely, steroid eye drops or antiviral medicines may be prescribed. Follow these instructions at home: Medicines  Take or apply over-the-counter and prescription medicines only as told by your health care provider.  Be very careful to avoid touching the edge of the eyelid with the eye drop bottle or ointment tube when applying medicines to the affected eye. Being careful this way will stop you from spreading the infection to the other eye or to other people. Eye care  Avoid touching or rubbing your eyes.  Apply a warm, wet, clean washcloth to your eye for 10-20 minutes, 3-4 times per day or as told by your health care provider.  If you wear contact lenses, do not wear them until the inflammation is gone and your health care provider says it is safe to wear them again. Ask your health care provider how to sterilize or replace your contact lenses before using them again. Wear glasses until you can resume wearing contacts.  Avoid wearing eye makeup until the inflammation is gone. Throw away any old eye cosmetics that may be contaminated.  Gently wipe away any drainage from your eye with a warm, wet washcloth or a cotton ball. General instructions  Change or wash your pillowcase every day or as told by  your health care provider.  Do not share towels, pillowcases, washcloths, eye makeup, makeup brushes, contact lenses, or glasses. This may spread the infection.  Wash your hands often with soap and water. Use paper towels to dry your hands. If soap and water are not available, use hand sanitizer.  Try to avoid contact with other people for one week or as told by your health care provider. Contact a health care provider  if:  Your symptoms do not improve with treatment or they get worse.  You have increased pain.  Your vision becomes blurry.  You have a fever.  You have facial pain, redness, or swelling.  You have yellow or green drainage coming from your eye.  You have new symptoms. This information is not intended to replace advice given to you by your health care provider. Make sure you discuss any questions you have with your health care provider. Document Released: 10/13/2002 Document Revised: 02/18/2016 Document Reviewed: 02/07/2016 Elsevier Interactive Patient Education  2017 [employer].     IF you received an x-ray today, you will receive an invoice from [facility]. Please contact [facility] at [phone] with questions or concerns regarding your invoice.   IF you received labwork today, you will receive an invoice from [place]. Please contact [facility] at [phone] with questions or concerns regarding your invoice.   Our billing staff will not be able to assist you with questions regarding bills from these companies.  You will be contacted with the lab results as soon as they are available. The fastest way to get your results is to activate your My Chart account. Instructions are located on the last page of this paperwork. If you have not heard from [place] regarding the results in 2 weeks, please contact this office.

## 2016-08-09 NOTE — Progress Notes (Signed)
Bryan ReilBrian Burke  MRN: [id number] DOB: [birth year]  PCP: Dow AdolphMCPHERSON,BARBARA, MD  Subjective:  Pt is 36 year old male presents to clinic for eye problem. Both eyes became red and irritated yesterday. + itching. Yesterday morning and this morning they were both matted shut "with cold". Denies pain, swelling. He was around his 452-year old nephew two weeks ago who possibly had pink eye. He is concerned about pink eye today.   Recent cold symptoms x one week  - nasal congestion and cough. Throat feels irritated. +sinus pressure. Is sleeping well at night.  Has tried DayQuil and "cold and flu stuff". Denies fever, chills, chest pain, chest pressure, SOB, wheezing, ear pain.   Review of Systems  Constitutional: Negative for chills, diaphoresis and fever.  HENT: Positive for congestion, postnasal drip, rhinorrhea and sinus pressure. Negative for sinus pain and trouble swallowing.   Respiratory: Negative for cough, chest tightness, shortness of breath and wheezing.   Cardiovascular: Negative for chest pain and palpitations.  Gastrointestinal: Negative for diarrhea, nausea and vomiting.  Neurological: Negative for dizziness, syncope, light-headedness and headaches.  Psychiatric/Behavioral: Negative for sleep disturbance.    Patient Active Problem List   Diagnosis Date Noted  . Family history of polycystic kidney 10/10/2013  . Snoring 10/09/2013  . Allergic rhinitis 10/09/2013    No current outpatient prescriptions on file prior to visit.   No current facility-administered medications on file prior to visit.     No Known Allergies   Objective:  BP 122/80 (BP Location: Right Arm, Patient Position: Sitting, Cuff Size: Small)   Pulse (!) 101   Temp 98.5 F (36.9 C) (Oral)   Resp 18   Ht 5\' 8"  (1.727 m)   Wt 181 lb (82.1 kg)   SpO2 97%   BMI 27.52 kg/m   Physical Exam  Constitutional: He is oriented to person, place, and time and well-developed, well-nourished, and in no distress. No  distress.  HENT:  Right Ear: Tympanic membrane normal.  Left Ear: Tympanic membrane normal.  Nose: Mucosal edema and rhinorrhea present. Right sinus exhibits no maxillary sinus tenderness and no frontal sinus tenderness. Left sinus exhibits no maxillary sinus tenderness and no frontal sinus tenderness.  Mouth/Throat: Mucous membranes are normal. Posterior oropharyngeal edema present. No oropharyngeal exudate or posterior oropharyngeal erythema.  Eyes: EOM are normal. Pupils are equal, round, and reactive to light. Right eye exhibits no discharge. Left eye exhibits no discharge. Right conjunctiva is injected. Left conjunctiva is injected.  Cardiovascular: Normal rate, regular rhythm and normal heart sounds.   Neurological: He is alert and oriented to person, place, and time. GCS score is 15.  Skin: Skin is warm and dry.  Psychiatric: Mood, memory, affect and judgment normal.  Vitals reviewed.   Assessment and Plan :  1. Viral conjunctivitis 2. Viral illness 3. Nasal congestion 4. Cough - Epinastine HCl 0.05 % ophthalmic solution; Place 1 drop into both eyes 2 (two) times daily.  Dispense: 10 mL; Refill: 1 - pseudoephedrine (SUDAFED 12 HOUR) 120 MG 12 hr tablet; Take 1 tablet (120 mg total) by mouth 2 (two) times daily.  Dispense: 20 tablet; Refill: 0 - fluticasone (FLONASE) 50 MCG/ACT nasal spray; Place 2 sprays into both nostrils daily.  Dispense: 16 g; Refill: 6 - Suspect viral conjunctivitis, as both eyes are itchy, not painful. Will treat supportively. Push fluids, rest. RTC if no improvement in 5-7 days.    Marco CollieWhitney Kharizma Lesnick, PA-C  [facility] 08/09/2016  8:34 AM

## 2016-10-22 ENCOUNTER — Emergency Department (HOSPITAL_COMMUNITY): Admission: EM | Admit: 2016-10-22 | Discharge: 2016-10-22 | Discharge status: Absent without leave | Payer: Self-pay
